# Patient Record
Sex: Female | Born: 1935 | Race: White | Hispanic: No | Marital: Married | Smoking: Former smoker
Health system: Southern US, Community
[De-identification: ages and names within clinical notes are randomized; demographics above are authoritative.]

## PROBLEM LIST (undated history)

## (undated) DIAGNOSIS — Z5189 Encounter for other specified aftercare: Secondary | ICD-10-CM

## (undated) DIAGNOSIS — D518 Other vitamin B12 deficiency anemias: Secondary | ICD-10-CM

## (undated) DIAGNOSIS — E079 Disorder of thyroid, unspecified: Secondary | ICD-10-CM

## (undated) DIAGNOSIS — F32A Depression, unspecified: Secondary | ICD-10-CM

## (undated) DIAGNOSIS — G63 Polyneuropathy in diseases classified elsewhere: Principal | ICD-10-CM

## (undated) DIAGNOSIS — K219 Gastro-esophageal reflux disease without esophagitis: Secondary | ICD-10-CM

## (undated) DIAGNOSIS — G2581 Restless legs syndrome: Secondary | ICD-10-CM

## (undated) DIAGNOSIS — M1990 Unspecified osteoarthritis, unspecified site: Secondary | ICD-10-CM

## (undated) DIAGNOSIS — M069 Rheumatoid arthritis, unspecified: Secondary | ICD-10-CM

## (undated) DIAGNOSIS — F419 Anxiety disorder, unspecified: Secondary | ICD-10-CM

## (undated) DIAGNOSIS — F329 Major depressive disorder, single episode, unspecified: Secondary | ICD-10-CM

## (undated) DIAGNOSIS — H8110 Benign paroxysmal vertigo, unspecified ear: Secondary | ICD-10-CM

## (undated) DIAGNOSIS — I10 Essential (primary) hypertension: Secondary | ICD-10-CM

## (undated) DIAGNOSIS — J449 Chronic obstructive pulmonary disease, unspecified: Secondary | ICD-10-CM

## (undated) DIAGNOSIS — I4891 Unspecified atrial fibrillation: Secondary | ICD-10-CM

## (undated) DIAGNOSIS — K449 Diaphragmatic hernia without obstruction or gangrene: Secondary | ICD-10-CM

## (undated) DIAGNOSIS — H3530 Unspecified macular degeneration: Secondary | ICD-10-CM

## (undated) DIAGNOSIS — R269 Unspecified abnormalities of gait and mobility: Secondary | ICD-10-CM

## (undated) HISTORY — DX: Depression, unspecified: F32.A

## (undated) HISTORY — PX: BACK SURGERY: SHX140

## (undated) HISTORY — PX: TONSILLECTOMY: SUR1361

## (undated) HISTORY — DX: Polyneuropathy in diseases classified elsewhere: G63

## (undated) HISTORY — DX: Essential (primary) hypertension: I10

## (undated) HISTORY — DX: Disorder of thyroid, unspecified: E07.9

## (undated) HISTORY — PX: CHOLECYSTECTOMY: SHX55

## (undated) HISTORY — PX: REPLACEMENT TOTAL KNEE: SUR1224

## (undated) HISTORY — DX: Chronic obstructive pulmonary disease, unspecified: J44.9

## (undated) HISTORY — DX: Benign paroxysmal vertigo, unspecified ear: H81.10

## (undated) HISTORY — DX: Encounter for other specified aftercare: Z51.89

## (undated) HISTORY — DX: Gastro-esophageal reflux disease without esophagitis: K21.9

## (undated) HISTORY — PX: CATARACT EXTRACTION: SUR2

## (undated) HISTORY — DX: Unspecified abnormalities of gait and mobility: R26.9

## (undated) HISTORY — DX: Unspecified osteoarthritis, unspecified site: M19.90

## (undated) HISTORY — PX: KYPHOPLASTY: SHX5884

## (undated) HISTORY — PX: SPINAL CORD STIMULATOR IMPLANT: SHX2422

## (undated) HISTORY — PX: ABDOMINAL HYSTERECTOMY: SHX81

## (undated) HISTORY — DX: Anxiety disorder, unspecified: F41.9

## (undated) HISTORY — DX: Major depressive disorder, single episode, unspecified: F32.9

## (undated) HISTORY — PX: ANKLE FRACTURE SURGERY: SHX122

## (undated) HISTORY — DX: Unspecified atrial fibrillation: I48.91

## (undated) HISTORY — DX: Restless legs syndrome: G25.81

## (undated) HISTORY — DX: Unspecified macular degeneration: H35.30

## (undated) HISTORY — PX: APPENDECTOMY: SHX54

## (undated) HISTORY — DX: Rheumatoid arthritis, unspecified: M06.9

## (undated) HISTORY — DX: Other vitamin B12 deficiency anemias: D51.8

---

## 2011-08-04 ENCOUNTER — Emergency Department (HOSPITAL_COMMUNITY): Payer: Medicare Other

## 2011-08-04 ENCOUNTER — Emergency Department (HOSPITAL_COMMUNITY)
Admission: EM | Admit: 2011-08-04 | Discharge: 2011-08-04 | Disposition: A | Discharge status: Home / Self Care | Payer: Medicare Other | Attending: Emergency Medicine | Admitting: Emergency Medicine

## 2011-08-04 DIAGNOSIS — E039 Hypothyroidism, unspecified: Secondary | ICD-10-CM | POA: Insufficient documentation

## 2011-08-04 DIAGNOSIS — W19XXXA Unspecified fall, initial encounter: Secondary | ICD-10-CM | POA: Insufficient documentation

## 2011-08-04 DIAGNOSIS — S22009A Unspecified fracture of unspecified thoracic vertebra, initial encounter for closed fracture: Secondary | ICD-10-CM | POA: Insufficient documentation

## 2011-08-04 DIAGNOSIS — J4489 Other specified chronic obstructive pulmonary disease: Secondary | ICD-10-CM | POA: Insufficient documentation

## 2011-08-04 DIAGNOSIS — Z96659 Presence of unspecified artificial knee joint: Secondary | ICD-10-CM | POA: Insufficient documentation

## 2011-08-04 DIAGNOSIS — Z79899 Other long term (current) drug therapy: Secondary | ICD-10-CM | POA: Insufficient documentation

## 2011-08-04 DIAGNOSIS — J449 Chronic obstructive pulmonary disease, unspecified: Secondary | ICD-10-CM | POA: Insufficient documentation

## 2011-08-04 DIAGNOSIS — F411 Generalized anxiety disorder: Secondary | ICD-10-CM | POA: Insufficient documentation

## 2011-08-04 DIAGNOSIS — G589 Mononeuropathy, unspecified: Secondary | ICD-10-CM | POA: Insufficient documentation

## 2011-08-04 DIAGNOSIS — I10 Essential (primary) hypertension: Secondary | ICD-10-CM | POA: Insufficient documentation

## 2011-08-18 ENCOUNTER — Other Ambulatory Visit: Payer: Self-pay | Admitting: Orthopedic Surgery

## 2011-08-18 DIAGNOSIS — R52 Pain, unspecified: Secondary | ICD-10-CM

## 2011-08-18 DIAGNOSIS — IMO0002 Reserved for concepts with insufficient information to code with codable children: Secondary | ICD-10-CM

## 2011-08-19 ENCOUNTER — Ambulatory Visit
Admission: RE | Admit: 2011-08-19 | Discharge: 2011-08-19 | Disposition: A | Discharge status: Home / Self Care | Payer: Medicare Other | Source: Ambulatory Visit | Attending: Orthopedic Surgery | Admitting: Orthopedic Surgery

## 2011-08-19 DIAGNOSIS — R52 Pain, unspecified: Secondary | ICD-10-CM

## 2011-08-19 DIAGNOSIS — IMO0002 Reserved for concepts with insufficient information to code with codable children: Secondary | ICD-10-CM

## 2011-09-10 ENCOUNTER — Telehealth: Payer: Self-pay | Admitting: Internal Medicine

## 2011-09-10 NOTE — Telephone Encounter (Signed)
S/w pt today re appt for 12/11 @ 1:30 pm.

## 2011-09-22 ENCOUNTER — Ambulatory Visit: Payer: Medicare Other

## 2011-09-22 ENCOUNTER — Encounter: Payer: Self-pay | Admitting: Internal Medicine

## 2011-09-22 ENCOUNTER — Ambulatory Visit (HOSPITAL_BASED_OUTPATIENT_CLINIC_OR_DEPARTMENT_OTHER): Payer: Medicare Other | Admitting: Internal Medicine

## 2011-09-22 ENCOUNTER — Other Ambulatory Visit: Payer: Medicare Other | Admitting: Lab

## 2011-09-22 ENCOUNTER — Telehealth: Payer: Self-pay | Admitting: Internal Medicine

## 2011-09-22 ENCOUNTER — Ambulatory Visit (HOSPITAL_BASED_OUTPATIENT_CLINIC_OR_DEPARTMENT_OTHER): Payer: Medicare Other

## 2011-09-22 VITALS — BP 137/77 | HR 80 | Temp 97.0°F | Ht 63.5 in | Wt 172.0 lb

## 2011-09-22 DIAGNOSIS — M549 Dorsalgia, unspecified: Secondary | ICD-10-CM

## 2011-09-22 DIAGNOSIS — D892 Hypergammaglobulinemia, unspecified: Secondary | ICD-10-CM

## 2011-09-22 DIAGNOSIS — R894 Abnormal immunological findings in specimens from other organs, systems and tissues: Secondary | ICD-10-CM

## 2011-09-22 LAB — COMPREHENSIVE METABOLIC PANEL
ALT: 13 U/L (ref 0–35)
AST: 22 U/L (ref 0–37)
Albumin: 3.9 g/dL (ref 3.5–5.2)
Alkaline Phosphatase: 70 U/L (ref 39–117)
Glucose, Bld: 91 mg/dL (ref 70–99)
Potassium: 4.2 mEq/L (ref 3.5–5.3)
Sodium: 138 mEq/L (ref 135–145)
Total Bilirubin: 0.3 mg/dL (ref 0.3–1.2)
Total Protein: 7.7 g/dL (ref 6.0–8.3)

## 2011-09-22 LAB — CBC WITH DIFFERENTIAL/PLATELET
BASO%: 0.6 % (ref 0.0–2.0)
Eosinophils Absolute: 0.6 10*3/uL — ABNORMAL HIGH (ref 0.0–0.5)
LYMPH%: 35.7 % (ref 14.0–49.7)
MCHC: 33.5 g/dL (ref 31.5–36.0)
MCV: 94.7 fL (ref 79.5–101.0)
MONO%: 10.8 % (ref 0.0–14.0)
NEUT#: 4.5 10*3/uL (ref 1.5–6.5)
Platelets: 421 10*3/uL — ABNORMAL HIGH (ref 145–400)
RBC: 4.3 10*6/uL (ref 3.70–5.45)
RDW: 14.6 % — ABNORMAL HIGH (ref 11.2–14.5)
WBC: 9.7 10*3/uL (ref 3.9–10.3)

## 2011-09-22 NOTE — Progress Notes (Signed)
REASON FOR CONSULTATION:  77 YO WF with ? Multiple Myeloma.  HPI Katrina Lynn is a 77 y.o. female  With PMH significant for COPD, hypertension, Hypothyroidism, and peripheral neuropathy. She fell down at hhome 6 weeks ago. She had XR of the lumbar spine that showed Age indeterminate compression fracture T12 with minimal loss of vertebral body height and no retropulsion, Mild retropulsion at L1 is likely chronic and Compression fractures at T11, L1-L2 with cement augmentation. The patient was seen by Dr.Gioffre and CT of the lumbar spine was performed on 08/20/11 and it showed Acute mild compression fracture of the superior endplate of L3 with minimal protrusion of bone into the spinal canal. Central soft disc protrusion at L5-S1 slightly asymmetric to the right. Gas in the right lateral recess at L4-5 which might affect the right L5 nerve root but there is no soft disc protrusion. Broad-based small disc protrusion at L3-4 with mild spinal  Stenosis. He also ordered blood work including SPEP which showed the possibility of faint restricted bands in the Gamma region.with elevated IgA to 577 but normal IgG and low IgM. He kindly referred the patient to me today for evaluation of this abnormality and to r/o Multiple Myeloma. The patient continues to complain of low back pain. She is currently on Gabapentin and Tramadol with minimal pain relief. She also has some weight loss secondary to poor appetite and SOB on exertion. No other significant complaints.  @SFHPI @  Past Medical History  Diagnosis Date  . Hypertension   . Anxiety   . Arthritis   . Asthma   . Blood transfusion   . COPD (chronic obstructive pulmonary disease)   . Depression   . GERD (gastroesophageal reflux disease)   . Thyroid disease     Past Surgical History  Procedure Date  . Appendectomy   . Back surgery   . Cholecystectomy   . Abdominal hysterectomy     Family History  Problem Relation Age of Onset  . Stroke Mother      Social History History  Substance Use Topics  . Smoking status: Former Smoker -- 0.2 packs/day for 3 years    Types: Cigarettes    Quit date: 09/22/1971  . Smokeless tobacco: Not on file  . Alcohol Use: 0.6 oz/week    1 Cans of beer per week    Allergies  Allergen Reactions  . Penicillins Swelling    Current Outpatient Prescriptions  Medication Sig Dispense Refill  . amitriptyline (ELAVIL) 25 MG tablet Take 25 mg by mouth 2 (two) times daily.        . folic acid (FOLVITE) 1 MG tablet Take 1 mg by mouth 2 (two) times daily.        Marland Kitchen gabapentin (NEURONTIN) 600 MG tablet Take 600 mg by mouth 3 (three) times daily.        Marland Kitchen levothyroxine (SYNTHROID, LEVOTHROID) 150 MCG tablet Take 150 mcg by mouth daily.        Marland Kitchen losartan (COZAAR) 100 MG tablet Take 100 mg by mouth daily.        . metoprolol (TOPROL-XL) 50 MG 24 hr tablet Take 50 mg by mouth daily.        . multivitamin-lutein (OCUVITE-LUTEIN) CAPS Take 1 capsule by mouth daily. Called brite eyes       . traMADol (ULTRAM) 50 MG tablet Take 50 mg by mouth every 6 (six) hours as needed. Maximum dose= 8 tablets per day  Review of Systems  A comprehensive review of systems was negative except for: Constitutional: positive for anorexia, fatigue and weight loss Respiratory: positive for dyspnea on exertion Musculoskeletal: positive for back pain  Physical Exam  WUJ:WJXBJ, healthy and no distress SKIN: skin color, texture, turgor are normal HEAD: Normocephalic, No masses, lesions, tenderness or abnormalities OROPHARYNX:no exudate, no erythema and lips, buccal mucosa, and tongue normal  NECK: supple, no adenopathy LYMPH:  no palpable lymphadenopathy LUNGS: clear to auscultation  HEART: regular rate & rhythm, no murmurs and no gallops ABDOMEN:abdomen soft, non-tender, normal bowel sounds and no masses or organomegaly BACK: Tender to palpation at the lumbar spines. EXTREMITIES:no joint deformities, effusion, or  inflammation, no edema, no clubbing, no cyanosis  NEURO: alert & oriented x 3 with fluent speech    Studies/Results: No results found.   Assessment: This is a very pleasant 37 YOWF with back pain and compression fracture but no clear lytic lesions on the previous imaging studies. The patient has a faint restricted band in the Gamma region, concerning for monoclonal Gammopathy and slightly elevated IgA. Multiple Myeloma can not be excluded but less likely. I have a lengthy discussion with the patient and her husband today about her condition and studies needed to R/O Multiple Myeloma.  Plan: 1# I ordered CBC, CMET, LDH and Myeloma panel today. 2# I referred the patient to Interventional Radiology for a bone marrow Biopsy and Aspirate. 3# I will see the patient back for follow up visit in 2 weeks for evaluation and discussion of her lab and biopsy results. 4# For Pain management, the patient will continue on Tramadol, Gabapentin and Advil PRN for now.  All questions were answered. The patient knows to call the clinic with any problems, questions or concerns. We can certainly see the patient much sooner if necessary.  Thank you so much for allowing me to participate in the care of Katrina Lynn. I will continue to follow up the patient with you and assist in her care.  I spent 25 minutes counseling the patient face to face. The total time spent in the appointment was .   Kersti Scavone K. 09/22/2011, 3:23 PM

## 2011-09-22 NOTE — Telephone Encounter (Signed)
gve the pt her dec 2012 appt calendar. S/w anna in rad regarding the pt needing  A bm biopsy. Pt is aware she will be contacted with that appt

## 2011-09-23 ENCOUNTER — Telehealth: Payer: Self-pay | Admitting: Internal Medicine

## 2011-09-23 NOTE — Telephone Encounter (Signed)
Tobi Bastos from central scheduling called to let us know  that pt is scheduled for Bone Marrow Bx on 09/29/11 at 0900 at [facility] -she notified the pt

## 2011-09-24 ENCOUNTER — Encounter (HOSPITAL_COMMUNITY): Payer: Self-pay | Admitting: Pharmacy Technician

## 2011-09-24 LAB — KAPPA/LAMBDA LIGHT CHAINS: Kappa:Lambda Ratio: 0.83 (ref 0.26–1.65)

## 2011-09-24 LAB — SPEP & IFE WITH QIG
Beta Globulin: 5.6 % (ref 4.7–7.2)
Gamma Globulin: 14.8 % (ref 11.1–18.8)
IgA: 548 mg/dL — ABNORMAL HIGH (ref 69–380)
IgG (Immunoglobin G), Serum: 1030 mg/dL (ref 690–1700)
Total Protein, Serum Electrophoresis: 7 g/dL (ref 6.0–8.3)

## 2011-09-24 LAB — BETA 2 MICROGLOBULIN, SERUM: Beta-2 Microglobulin: 3.54 mg/L — ABNORMAL HIGH (ref 1.01–1.73)

## 2011-09-28 ENCOUNTER — Other Ambulatory Visit (HOSPITAL_COMMUNITY): Payer: Self-pay | Admitting: *Deleted

## 2011-09-28 ENCOUNTER — Other Ambulatory Visit: Payer: Self-pay | Admitting: Radiology

## 2011-09-29 ENCOUNTER — Encounter (HOSPITAL_COMMUNITY): Payer: Self-pay

## 2011-09-29 ENCOUNTER — Ambulatory Visit (HOSPITAL_COMMUNITY)
Admission: RE | Admit: 2011-09-29 | Discharge: 2011-09-29 | Disposition: A | Discharge status: Home / Self Care | Payer: Medicare Other | Source: Ambulatory Visit | Attending: Internal Medicine | Admitting: Internal Medicine

## 2011-09-29 DIAGNOSIS — D892 Hypergammaglobulinemia, unspecified: Secondary | ICD-10-CM

## 2011-09-29 DIAGNOSIS — C9000 Multiple myeloma not having achieved remission: Secondary | ICD-10-CM | POA: Insufficient documentation

## 2011-09-29 LAB — CBC
HCT: 39.7 % (ref 36.0–46.0)
MCH: 30.6 pg (ref 26.0–34.0)
MCHC: 33.2 g/dL (ref 30.0–36.0)
RDW: 14.7 % (ref 11.5–15.5)

## 2011-09-29 LAB — PROTIME-INR
INR: 0.99 (ref 0.00–1.49)
Prothrombin Time: 13.3 seconds (ref 11.6–15.2)

## 2011-09-29 LAB — APTT: aPTT: 33 seconds (ref 24–37)

## 2011-09-29 MED ORDER — MIDAZOLAM HCL 5 MG/5ML IJ SOLN
INTRAMUSCULAR | Status: AC | PRN
  Administered 2011-09-29: 2 mg via INTRAVENOUS
  Administered 2011-09-29: 1 mg via INTRAVENOUS

## 2011-09-29 MED ORDER — FENTANYL CITRATE 0.05 MG/ML IJ SOLN
INTRAMUSCULAR | Status: AC | PRN
  Administered 2011-09-29: 50 ug via INTRAVENOUS
  Administered 2011-09-29: 100 ug via INTRAVENOUS

## 2011-09-29 MED ORDER — SODIUM CHLORIDE 0.9 % IV SOLN
Freq: Once | INTRAVENOUS | Status: AC
  Administered 2011-09-29: 08:00:00 via INTRAVENOUS

## 2011-09-29 NOTE — H&P (Signed)
Katrina Lynn is an 77 y.o. female.   Chief Complaint: Back Pain HPI: 77 yo female with a history of low back pain and multiple falls. Further back surgery is being considered per patient; however, bone marrow biopsy requested to rule out multiple myeloma.  Past Medical History  Diagnosis Date  . Hypertension   . Anxiety   . Arthritis   . Asthma   . Blood transfusion   . COPD (chronic obstructive pulmonary disease)   . Depression   . GERD (gastroesophageal reflux disease)   . Thyroid disease     Past Surgical History  Procedure Date  . Appendectomy   . Back surgery     x3 lumbar and thorasic  kyphoplasty  . Cholecystectomy   . Abdominal hysterectomy   . Cataract extraction     bilateral 1990    Family History  Problem Relation Age of Onset  . Stroke Mother    Social History:  reports that she quit smoking about 40 years ago. Her smoking use included Cigarettes. She has a .75 pack-year smoking history. She does not have any smokeless tobacco history on file. She reports that she drinks about .6 ounces of alcohol per week. She reports that she does not use illicit drugs.  Allergies:  Allergies  Allergen Reactions  . Penicillins Swelling    Medications Prior to Admission  Medication Sig Dispense Refill  . amitriptyline (ELAVIL) 25 MG tablet Take 50 mg by mouth at bedtime.       . folic acid (FOLVITE) 1 MG tablet Take 1 mg by mouth 2 (two) times daily.        Marland Kitchen gabapentin (NEURONTIN) 600 MG tablet Take 1,200 mg by mouth at bedtime.       Marland Kitchen ibuprofen (ADVIL,MOTRIN) 200 MG tablet Take 200 mg by mouth every 6 (six) hours as needed. PAIN        . levothyroxine (SYNTHROID, LEVOTHROID) 150 MCG tablet Take 150 mcg by mouth every morning.       Marland Kitchen losartan (COZAAR) 100 MG tablet Take 100 mg by mouth every evening.       . metoprolol (TOPROL-XL) 50 MG 24 hr tablet Take 50 mg by mouth every morning.       . multivitamin-lutein (OCUVITE-LUTEIN) CAPS Take 1 capsule by mouth 2 (two)  times daily. Called brite eyes      . traMADol (ULTRAM) 50 MG tablet Take 50 mg by mouth every 6 (six) hours as needed. Maximum dose= 8 tablets per day. PAIN        Medications Prior to Admission  Medication Dose Route Frequency Provider Last Rate Last Dose  . 0.9 %  sodium chloride infusion   Intravenous Once Robet Leu, PA 20 mL/hr at 09/29/11 0759      Results for orders placed during the hospital encounter of 09/29/11 (from the past 48 hour(s))  APTT     Status: Normal   Collection Time   09/29/11  7:50 AM      Component Value Range Comment   aPTT 33  24 - 37 (seconds)   CBC     Status: Abnormal   Collection Time   09/29/11  7:50 AM      Component Value Range Comment   WBC 8.6  4.0 - 10.5 (K/uL)    RBC 4.31  3.87 - 5.11 (MIL/uL)    Hemoglobin 13.2  12.0 - 15.0 (g/dL)    HCT 16.1  09.6 - 04.5 (%)  MCV 92.1  78.0 - 100.0 (fL)    MCH 30.6  26.0 - 34.0 (pg)    MCHC 33.2  30.0 - 36.0 (g/dL)    RDW 69.6  29.5 - 28.4 (%)    Platelets 438 (*) 150 - 400 (K/uL)   PROTIME-INR     Status: Normal   Collection Time   09/29/11  7:50 AM      Component Value Range Comment   Prothrombin Time 13.3  11.6 - 15.2 (seconds)    INR 0.99  0.00 - 1.49     No results found.  Review of Systems  Constitutional: Negative for fever and chills.  Eyes: Positive for blurred vision.  Respiratory: Positive for shortness of breath. Negative for cough and wheezing.   Cardiovascular: Negative for chest pain.  Gastrointestinal: Negative for abdominal pain.  Musculoskeletal: Positive for back pain.  Endo/Heme/Allergies: Does not bruise/bleed easily.    Blood pressure 140/80, pulse 88, temperature 98.5 F (36.9 C), temperature source Oral, resp. rate 18, SpO2 96.00%. Physical Exam  Heent - unremarkable Airway -1 Heart - RRR Lungs - decreased breath sounds but clear Abd - soft NT  Assessment/Plan CT guided bone marrow biopsy today Dr. Miles Costain.  Katrina Lynn 09/29/2011, 9:15 AM

## 2011-09-29 NOTE — Progress Notes (Signed)
Pt states she "is supposed to use Oxygen at home at night" and mostly does this.  2 liters. Doe snot use it during the day

## 2011-09-29 NOTE — Procedures (Signed)
Successful LT iliac BM asp and core bx with CT guidance No comp Stable path pending

## 2011-10-07 ENCOUNTER — Ambulatory Visit (HOSPITAL_BASED_OUTPATIENT_CLINIC_OR_DEPARTMENT_OTHER): Payer: Medicare Other | Admitting: Internal Medicine

## 2011-10-07 VITALS — BP 147/89 | HR 95 | Temp 97.2°F | Ht 63.5 in | Wt 171.1 lb

## 2011-10-07 DIAGNOSIS — M545 Low back pain: Secondary | ICD-10-CM

## 2011-10-07 DIAGNOSIS — D892 Hypergammaglobulinemia, unspecified: Secondary | ICD-10-CM

## 2011-10-07 NOTE — Progress Notes (Signed)
[facility] OFFICE PROGRESS NOTE  DIAGNOSIS: Monospecific paraproteinemia  CURRENT THERAPY: None.  INTERVAL HISTORY: Katrina Lynn 77 y.o. female returns to the clinic today for followup visit accompanied her husband and daughter. The patient still complaining of low back pain. She was seen initially for evaluation of questionable multiple myeloma. She had a bone marrow biopsy and aspirate performed recently and she is here today for evaluation of her biopsy and lab results. Her myeloma panel showed no significant abnormalities. The patient denied having any other significant complaints except for the back pain.  MEDICAL HISTORY: Past Medical History  Diagnosis Date  . Hypertension   . Anxiety   . Arthritis   . Asthma   . Blood transfusion   . COPD (chronic obstructive pulmonary disease)   . Depression   . GERD (gastroesophageal reflux disease)   . Thyroid disease     ALLERGIES:  is allergic to penicillins.  MEDICATIONS:  Current Outpatient Prescriptions  Medication Sig Dispense Refill  . ALPRAZolam (XANAX) 0.5 MG tablet Take 0.25 mg by mouth as needed.        Marland Kitchen amitriptyline (ELAVIL) 25 MG tablet Take 50 mg by mouth at bedtime.       . fluticasone-salmeterol (ADVAIR HFA) 115-21 MCG/ACT inhaler Inhale 2 puffs into the lungs as needed.        . folic acid (FOLVITE) 1 MG tablet Take 1 mg by mouth 2 (two) times daily.        Marland Kitchen gabapentin (NEURONTIN) 600 MG tablet Take 1,200 mg by mouth at bedtime.       Marland Kitchen ibuprofen (ADVIL,MOTRIN) 200 MG tablet Take 200 mg by mouth every 6 (six) hours as needed. PAIN        . levothyroxine (SYNTHROID, LEVOTHROID) 150 MCG tablet Take 150 mcg by mouth every morning.       Marland Kitchen losartan (COZAAR) 100 MG tablet Take 100 mg by mouth every evening.       . metoprolol (TOPROL-XL) 50 MG 24 hr tablet Take 50 mg by mouth every morning.       . multivitamin-lutein (OCUVITE-LUTEIN) CAPS Take 1 capsule by mouth 2 (two) times daily. Called brite eyes       . tiotropium (SPIRIVA HANDIHALER) 18 MCG inhalation capsule Place 18 mcg into inhaler and inhale as needed.        . traMADol (ULTRAM) 50 MG tablet Take 50 mg by mouth every 6 (six) hours as needed. Maximum dose= 8 tablets per day. PAIN         SURGICAL HISTORY:  Past Surgical History  Procedure Date  . Appendectomy   . Back surgery     x3 lumbar and thorasic  kyphoplasty  . Cholecystectomy   . Abdominal hysterectomy   . Cataract extraction     bilateral 1990    REVIEW OF SYSTEMS:  A comprehensive review of systems was negative except for: Musculoskeletal: positive for back pain   PHYSICAL EXAMINATION: General appearance: alert, cooperative and no distress Head: Normocephalic, without obvious abnormality, atraumatic Neck: no adenopathy Lymph nodes: Cervical, supraclavicular, and axillary nodes normal. Resp: clear to auscultation bilaterally Cardio: regular rate and rhythm, S1, S2 normal, no murmur, click, rub or gallop GI: soft, non-tender; bowel sounds normal; no masses,  no organomegaly Extremities: extremities normal, atraumatic, no cyanosis or edema Neurologic: Alert and oriented X 3, normal strength and tone. Normal symmetric reflexes. Normal coordination and gait  ECOG PERFORMANCE STATUS: 1 - Symptomatic but completely ambulatory  Blood  pressure 147/89, pulse 95, temperature 97.2 F (36.2 C), temperature source Oral, height 5' 3.5" (1.613 m), weight 171 lb 1.6 oz (77.61 kg).  LABORATORY DATA: Lab Results  Component Value Date   WBC 8.6 09/29/2011   HGB 13.2 09/29/2011   HCT 39.7 09/29/2011   MCV 92.1 09/29/2011   PLT 438* 09/29/2011      Chemistry      Component Value Date/Time   NA 138 09/22/2011 1557   K 4.2 09/22/2011 1557   CL 102 09/22/2011 1557   CO2 27 09/22/2011 1557   BUN 15 09/22/2011 1557   CREATININE 0.87 09/22/2011 1557      Component Value Date/Time   CALCIUM 9.6 09/22/2011 1557   ALKPHOS 70 09/22/2011 1557   AST 22 09/22/2011 1557   ALT  13 09/22/2011 1557   BILITOT 0.3 09/22/2011 1557       RADIOGRAPHIC STUDIES: Ct Biopsy  09/29/2011  *RADIOLOGY REPORT*  Clinical Data:  Multiple myeloma  CT GUIDED LEFT ILIAC BONE MARROW ASPIRATION AND CORE BIOPSY  Date:  09/29/2011 09:07:00  Radiologist:  Judie Petit. Ruel Favors, M.D.  Medications:  3 mg Versed, 200 mcg Fentanyl  Guidance:  CT  Sedation time:  15 minutes  Contrast volume:  None.  Complications:  No immediate  PROCEDURE/FINDINGS:  Informed consent was obtained from the patient following explanation of the procedure, risks, benefits and alternatives. The patient understands, agrees and consents for the procedure. All questions were addressed.  A time out was performed.  The patient was positioned prone and noncontrast localization CT was performed of the pelvis to demonstrate the iliac marrow spaces.  Maximal barrier sterile technique utilized including caps, mask, sterile gowns, sterile gloves, large sterile drape, hand hygiene, and betadine prep.  Under sterile conditions and local anesthesia, an 11 gauge coaxial bone biopsy needle was advanced into the left iliac marrow space. Needle position was confirmed with CT imaging. Initially, bone marrow aspiration was performed. Next, the 11 gauge outer cannula was utilized to obtain a left iliac bone marrow core biopsy. Needle was removed. Hemostasis was obtained with compression. The patient tolerated the procedure well. Samples were prepared with the cytotechnologist. No immediate complications.  IMPRESSION: CT guided left iliac bone marrow aspiration and core biopsy.  Original Report Authenticated By: Judie Petit. Ruel Favors, M.D.   BONE MARROW REPORT FINAL DIAGNOSIS Diagnosis Bone Marrow, Aspirate,Biopsy, and Clot, left iliac - HYPERCELLULAR BONE MARROW FOR AGE WITH TRILINEAGE HEMATOPOIESIS AND 6% PLASMA CELLS. - MAST CELL HYPERPLASIA. - SCANTY IRON STORES. - SEE COMMENT. PERIPHERAL BLOOD: - SLIGHT THROMBOCYTOSIS. Diagnosis Note The bone  marrow is hypercellular for age with trilineage hematopoiesis. There is slight increase in scattered mast cells throughout the bone marrow but with no clustering as seen in the aspirate, clot and biopsy. This is compatible with mast cell hyperplasia which may be related to the presence of a chronic inflammatory disorder. The plasma cells represent 6% of all cells with associated small clusters composed of few cells. Large clusters or sheets of plasma cells are not identified. Immunochemical stains were performed but were not entirely contributory given the prominent background staining with kappa and lambda light chains. In focal, relatively well stained areas, the plasma cells appear polyclonal. Clinical correlation is strongly recommended. (BNS:jy) 10/01/11  Guerry Bruin MD Pathologist, Electronic Signature (Case signed 10/02/2011)  ASSESSMENT AND PLAN:  This is a very pleasant 77 years old white female with low back pain and nonspecific paraproteinemia. The bone marrow biopsy and aspirate as well  as the myeloma that it did not show any evidence for multiple myeloma, but continues mass cells consistent with inflammatory process. I discussed the biopsy and the lab result with the patient today. I recommended for her observation with followup visit with her primary care physician as well as Dr. Melton Krebs for management of her back pain. I don't see a need for The patient will continue routine followup visit with me at this point but that he be happy to see her in the future if needed.   All questions were answered. The patient knows to call the clinic with any problems, questions or concerns.

## 2011-10-27 ENCOUNTER — Other Ambulatory Visit (HOSPITAL_COMMUNITY): Payer: Self-pay | Admitting: Orthopedic Surgery

## 2011-10-27 DIAGNOSIS — S32030A Wedge compression fracture of third lumbar vertebra, initial encounter for closed fracture: Secondary | ICD-10-CM

## 2011-10-29 ENCOUNTER — Encounter: Payer: Self-pay | Admitting: Internal Medicine

## 2011-11-04 ENCOUNTER — Encounter (HOSPITAL_COMMUNITY)
Admission: RE | Admit: 2011-11-04 | Discharge: 2011-11-04 | Disposition: A | Discharge status: Home / Self Care | Payer: Medicare Other | Source: Ambulatory Visit | Attending: Orthopedic Surgery | Admitting: Orthopedic Surgery

## 2011-11-04 DIAGNOSIS — S32030A Wedge compression fracture of third lumbar vertebra, initial encounter for closed fracture: Secondary | ICD-10-CM

## 2011-11-04 DIAGNOSIS — M545 Low back pain, unspecified: Secondary | ICD-10-CM | POA: Insufficient documentation

## 2011-11-04 DIAGNOSIS — Z96659 Presence of unspecified artificial knee joint: Secondary | ICD-10-CM | POA: Insufficient documentation

## 2011-11-04 MED ORDER — TECHNETIUM TC 99M MEDRONATE IV KIT
25.0000 | PACK | Freq: Once | INTRAVENOUS | Status: AC | PRN
  Administered 2011-11-04: 25 via INTRAVENOUS

## 2011-11-10 ENCOUNTER — Other Ambulatory Visit: Payer: Self-pay | Admitting: Orthopedic Surgery

## 2011-11-10 DIAGNOSIS — R1900 Intra-abdominal and pelvic swelling, mass and lump, unspecified site: Secondary | ICD-10-CM

## 2011-11-12 ENCOUNTER — Ambulatory Visit
Admission: RE | Admit: 2011-11-12 | Discharge: 2011-11-12 | Disposition: A | Discharge status: Home / Self Care | Payer: Medicare Other | Source: Ambulatory Visit | Attending: Orthopedic Surgery | Admitting: Orthopedic Surgery

## 2011-11-12 DIAGNOSIS — R1900 Intra-abdominal and pelvic swelling, mass and lump, unspecified site: Secondary | ICD-10-CM

## 2011-11-12 MED ORDER — IOHEXOL 300 MG/ML  SOLN: 100.0000 mL | Freq: Once | INTRAMUSCULAR | Status: AC | PRN

## 2011-11-12 MED ORDER — IOHEXOL 300 MG/ML  SOLN
100.0000 mL | Freq: Once | INTRAMUSCULAR | Status: AC | PRN
  Administered 2011-11-12: 100 mL via INTRAVENOUS

## 2013-06-03 IMAGING — CT CT BIOPSY
1 series · 1 of 16 positions shown · non-contrast
Comparison: none

CLINICAL DATA: Multiple myeloma

[Series 2: localizer · axial · 5.0mm · 0.69mm/px · 1 of 16 slices shown]
[im 9/16]
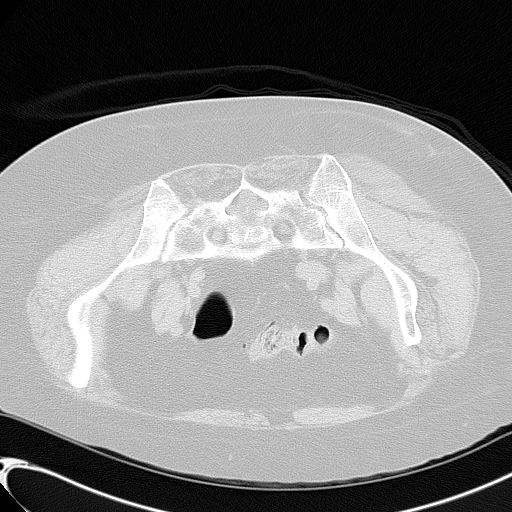

[1 of 16 positions shown; findings below may reference images not displayed]

CT GUIDED LEFT ILIAC BONE MARROW ASPIRATION AND CORE BIOPSY

Date:  09/29/2011 [DATE]

Radiologist:  Motobere Romai, M.D.

Medications:  3 mg Versed, 200 mcg Fentanyl

Guidance:  CT

Sedation time:  15 minutes

Contrast volume:  None.

Complications:  No immediate

PROCEDURE/FINDINGS:

Informed consent was obtained from the patient following
explanation of the procedure, risks, benefits and alternatives.
The patient understands, agrees and consents for the procedure.
All questions were addressed.  A time out was performed.

The patient was positioned prone and noncontrast localization CT
was performed of the pelvis to demonstrate the iliac marrow spaces.

Maximal barrier sterile technique utilized including caps, mask,
sterile gowns, sterile gloves, large sterile drape, hand hygiene,
and betadine prep.

Under sterile conditions and local anesthesia, an 11 gauge coaxial
bone biopsy needle was advanced into the left iliac marrow space.
Needle position was confirmed with CT imaging. Initially, bone
marrow aspiration was performed. Next, the 11 gauge outer cannula
was utilized to obtain a left iliac bone marrow core biopsy. Needle
was removed. Hemostasis was obtained with compression. The patient
tolerated the procedure well. Samples were prepared with the
cytotechnologist. No immediate complications.
IMPRESSION: CT guided left iliac bone marrow aspiration and core biopsy.

## 2013-07-21 ENCOUNTER — Encounter: Payer: Self-pay | Admitting: Neurology

## 2013-07-24 ENCOUNTER — Encounter: Payer: Self-pay | Admitting: Neurology

## 2013-07-24 ENCOUNTER — Ambulatory Visit (INDEPENDENT_AMBULATORY_CARE_PROVIDER_SITE_OTHER): Payer: Medicare Other | Admitting: Neurology

## 2013-07-24 ENCOUNTER — Encounter (INDEPENDENT_AMBULATORY_CARE_PROVIDER_SITE_OTHER): Payer: Self-pay

## 2013-07-24 VITALS — BP 143/89 | HR 91 | Ht 65.0 in | Wt 181.0 lb

## 2013-07-24 DIAGNOSIS — G63 Polyneuropathy in diseases classified elsewhere: Secondary | ICD-10-CM

## 2013-07-24 DIAGNOSIS — H8110 Benign paroxysmal vertigo, unspecified ear: Secondary | ICD-10-CM

## 2013-07-24 DIAGNOSIS — G2581 Restless legs syndrome: Secondary | ICD-10-CM | POA: Insufficient documentation

## 2013-07-24 HISTORY — DX: Polyneuropathy in diseases classified elsewhere: G63

## 2013-07-24 HISTORY — DX: Restless legs syndrome: G25.81

## 2013-07-24 HISTORY — DX: Benign paroxysmal vertigo, unspecified ear: H81.10

## 2013-07-24 MED ORDER — DULOXETINE HCL 30 MG PO CPEP: ORAL_CAPSULE | ORAL | Status: DC

## 2013-07-24 NOTE — Progress Notes (Signed)
Reason for visit: Dizziness  Katrina Lynn is a 79 y.o. female  History of present illness:  Katrina Lynn is an 79 year old right-handed white female with a history of a peripheral neuropathy associated with a gait disturbance. The patient indicates that she has had neuropathy symptoms for at least 12 years. Within the last several weeks, the patient has developed dizziness associated with a true vertigo sensation. The patient indicates that her initial symptoms began when she was lying in bed, and she rolled to the right. The patient had onset of true vertigo associated with some nausea, no vomiting. The patient has also noted that the vertigo will come on if she sits up out of bed, with vertigo lasting about 30 seconds. The patient will have episodes of vertigo off and on throughout the day if she turns her head a particular way. The patient denies any headache, double vision, loss of vision, or numbness or weakness that is new on the arms, legs, or face. The patient denies any change in hearing, but she indicates that several years ago she lost hearing in her left ear. The patient was seen through an ENT doctor at that time, and was told she had a viral illness. The patient does have gait instability associated with her neuropathy, and she has a lot of burning and stinging in her feet at night. The patient has difficulty sleeping because of this. The patient had a squeezing sensation in the feet. The patient denies any true syncope, and she denies neck discomfort. The patient does have chronic low back pain, and she has a spinal cord stimulator in place. The patient is sent to this office for an evaluation.  Past Medical History  Diagnosis Date  . Hypertension   . Anxiety   . Arthritis   . Asthma   . Blood transfusion   . COPD (chronic obstructive pulmonary disease)   . Depression   . GERD (gastroesophageal reflux disease)   . Thyroid disease   . Polyneuropathy in other diseases classified  elsewhere 07/24/2013  . Benign paroxysmal positional vertigo 07/24/2013  . Restless legs syndrome (RLS) 07/24/2013  . Rheumatoid arthritis   . Macular degeneration   . Atrial fibrillation     Past Surgical History  Procedure Laterality Date  . Appendectomy    . Back surgery      x3 lumbar and thorasic  kyphoplasty  . Cholecystectomy    . Abdominal hysterectomy    . Cataract extraction      bilateral 1990  . Tonsillectomy    . Spinal cord stimulator implant    . Replacement total knee      Right  . Ankle fracture surgery      Left    Family History  Problem Relation Age of Onset  . Stroke Mother   . Emphysema Sister   . Heart disease Sister     Social history:  reports that she quit smoking about 41 years ago. Her smoking use included Cigarettes. She has a .75 pack-year smoking history. She has never used smokeless tobacco. She reports that she drinks about 0.6 ounces of alcohol per week. She reports that she does not use illicit drugs.  Medications:  Current Outpatient Prescriptions on File Prior to Visit  Medication Sig Dispense Refill  . ALPRAZolam (XANAX) 0.5 MG tablet Take 0.25 mg by mouth as needed.        . folic acid (FOLVITE) 1 MG tablet Take 1 mg by mouth 2 (two) times  daily.        . gabapentin (NEURONTIN) 600 MG tablet Take 600 mg by mouth 3 (three) times daily.       Marland Kitchen ibuprofen (ADVIL,MOTRIN) 200 MG tablet Take 200 mg by mouth every 6 (six) hours as needed. PAIN        . levothyroxine (SYNTHROID, LEVOTHROID) 150 MCG tablet Take 150 mcg by mouth every morning.       Marland Kitchen losartan (COZAAR) 100 MG tablet Take 100 mg by mouth every evening.       . metoprolol (TOPROL-XL) 50 MG 24 hr tablet Take 50 mg by mouth every morning.       . multivitamin-lutein (OCUVITE-LUTEIN) CAPS Take 1 capsule by mouth 2 (two) times daily. Called brite eyes      . tiotropium (SPIRIVA HANDIHALER) 18 MCG inhalation capsule Place 18 mcg into inhaler and inhale as needed.         No  current facility-administered medications on file prior to visit.      Allergies  Allergen Reactions  . Penicillins Swelling  . Iohexol Hives    Hives and itching after inj    ROS:  Out of a complete 14 system review of symptoms, the patient complains only of the following symptoms, and all other reviewed systems are negative.  Fatigue Swelling in the legs Hearing loss, vertigo Birthmarks Blurred vision, loss of vision Shortness of breath Diarrhea Urination problems Easy bruising Feeling hot, cold, increased thirst Joint pain, muscle cramps, achy muscles Allergies Numbness, weakness, dizziness Anxiety, decreased energy Restless legs  Blood pressure 143/89, pulse 91, height 5\' 5"  (1.651 m), weight 181 lb (82.101 kg).  Physical Exam  General: The patient is alert and cooperative at the time of the examination. Tympanic membranes are clear bilaterally.  Head: Pupils are equal, round, and reactive to light. Discs are flat bilaterally.  Neck: The neck is supple, no carotid bruits are noted.  Respiratory: The respiratory examination is clear.  Cardiovascular: The cardiovascular examination reveals a regular rate and rhythm, no obvious murmurs or rubs are noted.  Skin: Extremities are without significant edema.  Neurologic Exam  Mental status:  Cranial nerves: Facial symmetry is present. There is good sensation of the face to pinprick and soft touch bilaterally. The strength of the facial muscles and the muscles to head turning and shoulder shrug are normal bilaterally. Speech is well enunciated, no aphasia or dysarthria is noted. Extraocular movements are full. Visual fields are full.  Motor: The motor testing reveals 5 over 5 strength of all 4 extremities. Good symmetric motor tone is noted throughout.  Sensory: Sensory testing is intact to pinprick, soft touch, vibration sensation, and position sense on the upper extremities. With the lower extremities, the patient  has a stocking pattern pinprick sensory deficit up to the knees bilaterally. Vibration sensation is impaired in the right greater than left lower extremities, and position sense is significantly impaired in both feet. No evidence of extinction is noted.  Coordination: Cerebellar testing reveals good finger-nose-finger and heel-to-shin bilaterally. The Nyan-Barrany procedure was negative.  Gait and station: Gait is slightly wide-based, the patient is able to walk independently. Tandem gait is unsteady. Romberg is positive. No drift is seen.  Reflexes: Deep tendon reflexes are symmetric, but are depressed bilaterally. Toes are downgoing bilaterally.   Assessment/Plan:  1. Peripheral neuropathy  2. Probable benign positional vertigo  3. Gait disorder  4. Restless leg syndrome  The patient will be set up for a cerebrovascular workup to  exclude this as an etiology for her dizziness, but the patient likely has benign positional vertigo. The patient indicates that her vertigo has improved over the last several days. If this recurs, the patient will be sent for vestibular rehabilitation. The patient will undergo a CT scan of the brain, and a carotid Doppler study. The patient will undergo blood work today, and she will have a nerve conduction study done. The patient will followup in 4 months. The patient will be given a trial on Cymbalta for her neuropathy discomfort. The patient will remain on her gabapentin.  Jill Alexanders MD 07/24/2013 7:12 PM  [facility] [address] [place], [place] [address]  Phone [phone] Fax [phone]

## 2013-07-26 ENCOUNTER — Telehealth: Payer: Self-pay | Admitting: Neurology

## 2013-07-26 LAB — IFE AND PE, SERUM
Alpha 1: 0.2 g/dL (ref 0.1–0.4)
Alpha2 Glob SerPl Elph-Mcnc: 0.7 g/dL (ref 0.4–1.2)
B-Globulin SerPl Elph-Mcnc: 1.3 g/dL (ref 0.6–1.3)
IgA/Immunoglobulin A, Serum: 559 mg/dL — ABNORMAL HIGH (ref 91–414)
IgG (Immunoglobin G), Serum: 737 mg/dL (ref 700–1600)
IgM (Immunoglobulin M), Srm: 18 mg/dL — ABNORMAL LOW (ref 40–230)
Total Protein: 6.5 g/dL (ref 6.0–8.5)

## 2013-07-26 LAB — ANGIOTENSIN CONVERTING ENZYME: Angio Convert Enzyme: 34 U/L (ref 14–82)

## 2013-07-26 LAB — TSH

## 2013-07-26 LAB — VITAMIN B12: Vitamin B-12: 146 pg/mL — ABNORMAL LOW (ref 211–946)

## 2013-07-26 NOTE — Telephone Encounter (Signed)
I called patient. The blood work revealed evidence of a low B12 level, the patient will go on 1000 mcg tablets of vitamin B12 daily. Thyroid was slightly low, I'll send results to primary care physician. The patient is on Synthroid.

## 2013-08-01 ENCOUNTER — Telehealth: Payer: Self-pay | Admitting: Neurology

## 2013-08-01 ENCOUNTER — Ambulatory Visit
Admission: RE | Admit: 2013-08-01 | Discharge: 2013-08-01 | Disposition: A | Discharge status: Home / Self Care | Payer: Medicare Other | Source: Ambulatory Visit | Attending: Neurology | Admitting: Neurology

## 2013-08-01 ENCOUNTER — Ambulatory Visit (INDEPENDENT_AMBULATORY_CARE_PROVIDER_SITE_OTHER): Payer: Medicare Other | Admitting: Radiology

## 2013-08-01 DIAGNOSIS — H8110 Benign paroxysmal vertigo, unspecified ear: Secondary | ICD-10-CM

## 2013-08-01 DIAGNOSIS — G63 Polyneuropathy in diseases classified elsewhere: Secondary | ICD-10-CM

## 2013-08-01 DIAGNOSIS — G2581 Restless legs syndrome: Secondary | ICD-10-CM

## 2013-08-01 DIAGNOSIS — R42 Dizziness and giddiness: Secondary | ICD-10-CM

## 2013-08-01 NOTE — Telephone Encounter (Signed)
I called patient. The nerve conduction studies show evidence of a peripheral neuropathy of moderate severity. This would be severe enough to impact balance to some degree. The CT scan of brain by my reading appears to be relatively unremarkable with exception of some cortical atrophy. No significant small vessel disease is noted.

## 2013-08-01 NOTE — Procedures (Signed)
  HISTORY:  Katrina Lynn is an 79 year old patient with a history of a peripheral neuropathy associated with gait instability and burning in the feet. The patient also has a history of dizziness. The patient is being evaluated for the peripheral neuropathy.   NERVE CONDUCTION STUDIES:  Nerve conduction studies were performed on the right upper extremity. The distal motor latencies and motor amplitudes for the median and ulnar nerves were within normal limits, with normal F wave latencies and nerve conduction velocities seen. The sensory latency for the right median nerve was slightly prolonged, and borderline normal for the right ulnar nerve.  Nerve conduction studies were performed on both lower extremities. The distal motor latency for the right peroneal nerve was prolonged, normal on the left. Low motor amplitudes were noted for these nerves. The distal motor latencies for the posterior tibial nerves were within normal limits bilaterally, with low motor amplitudes for these nerves bilaterally. The nerve conduction velocities for the right peroneal nerve was low, normal on the left, and slowed for the right posterior tibial nerve, normal on the left. The F wave latencies for the peroneal nerves were absent on the right, prolonged on the left, and normal for the left posterior tibial nerve, prolonged on the right. The peroneal sensory latencies were absent bilaterally.  EMG STUDIES:   EMG evaluation was not performed.  IMPRESSION:  Nerve conduction studies done on the right upper extremity and on both lower extremities shows evidence of a primarily axonal peripheral neuropathy of moderate severity. No other significant abnormalities were seen.  Marlan Palau MD 08/01/2013 5:33 PM  [facility] [address] [place], [place] [address]  Phone [phone] Fax [phone]

## 2013-08-08 ENCOUNTER — Ambulatory Visit (INDEPENDENT_AMBULATORY_CARE_PROVIDER_SITE_OTHER): Payer: Medicare Other

## 2013-08-08 DIAGNOSIS — G2581 Restless legs syndrome: Secondary | ICD-10-CM

## 2013-08-08 DIAGNOSIS — G63 Polyneuropathy in diseases classified elsewhere: Secondary | ICD-10-CM

## 2013-08-08 DIAGNOSIS — H8110 Benign paroxysmal vertigo, unspecified ear: Secondary | ICD-10-CM

## 2013-08-08 DIAGNOSIS — R42 Dizziness and giddiness: Secondary | ICD-10-CM

## 2013-08-14 ENCOUNTER — Telehealth: Payer: Self-pay | Admitting: Neurology

## 2013-08-14 DIAGNOSIS — M545 Low back pain: Secondary | ICD-10-CM

## 2013-08-14 NOTE — Telephone Encounter (Signed)
I called patient. The carotid Doppler study was unremarkable. The patient is having low back pain following a fall. I will check x-rays of the low back, the patient has had compression fractures in the past requiring kyphoplasty.

## 2013-08-18 ENCOUNTER — Telehealth: Payer: Self-pay | Admitting: Neurology

## 2013-08-18 ENCOUNTER — Ambulatory Visit
Admission: RE | Admit: 2013-08-18 | Discharge: 2013-08-18 | Disposition: A | Discharge status: Home / Self Care | Payer: Medicare Other | Source: Ambulatory Visit | Attending: Neurology | Admitting: Neurology

## 2013-08-18 DIAGNOSIS — M545 Low back pain: Secondary | ICD-10-CM

## 2013-08-18 NOTE — Telephone Encounter (Signed)
I called patient. The x-ray of the lumbar spine did not show any new compression fractures. The patient had kyphoplasty previously at T11, L1 and L2 levels. The patient also has endplate fractures at the T12, L3 and L4 levels. The patient still having some back pain, she will be entering water aerobics for coming exercises.

## 2013-12-29 ENCOUNTER — Other Ambulatory Visit: Payer: Self-pay

## 2013-12-29 MED ORDER — DULOXETINE HCL 30 MG PO CPEP: ORAL_CAPSULE | ORAL | Status: DC

## 2014-01-25 ENCOUNTER — Encounter: Payer: Self-pay | Admitting: Neurology

## 2014-01-25 ENCOUNTER — Ambulatory Visit (INDEPENDENT_AMBULATORY_CARE_PROVIDER_SITE_OTHER): Payer: Medicare Other | Admitting: Neurology

## 2014-01-25 ENCOUNTER — Encounter (INDEPENDENT_AMBULATORY_CARE_PROVIDER_SITE_OTHER): Payer: Self-pay

## 2014-01-25 VITALS — BP 166/79 | HR 83 | Wt 166.0 lb

## 2014-01-25 DIAGNOSIS — G2581 Restless legs syndrome: Secondary | ICD-10-CM

## 2014-01-25 DIAGNOSIS — G63 Polyneuropathy in diseases classified elsewhere: Secondary | ICD-10-CM

## 2014-01-25 DIAGNOSIS — D518 Other vitamin B12 deficiency anemias: Secondary | ICD-10-CM | POA: Insufficient documentation

## 2014-01-25 DIAGNOSIS — H8110 Benign paroxysmal vertigo, unspecified ear: Secondary | ICD-10-CM

## 2014-01-25 DIAGNOSIS — R269 Unspecified abnormalities of gait and mobility: Secondary | ICD-10-CM

## 2014-01-25 HISTORY — DX: Unspecified abnormalities of gait and mobility: R26.9

## 2014-01-25 HISTORY — DX: Other vitamin B12 deficiency anemias: D51.8

## 2014-01-25 MED ORDER — DULOXETINE HCL 60 MG PO CPEP: 60.0000 mg | ORAL_CAPSULE | Freq: Two times a day (BID) | ORAL | Status: DC

## 2014-01-25 NOTE — Progress Notes (Signed)
Reason for visit: Peripheral neuropathy  Katrina Lynn is an 80 y.o. female  History of present illness:  Katrina Lynn is at 80 year old right-handed white female with a history of a peripheral neuropathy of moderate severity. The patient does have some discomfort in the legs, from the knee level down. The patient is on gabapentin, Effexor, and Cymbalta without much benefit. The patient does have a gait disorder, and she will fall on occasion. The patient has a cane, and she has a scooter that she uses in the home. The patient comes in today without using any assistive device for walking. The patient also indicates that over the last 3 days, her positional vertigo has recurred. The patient is having some nausea associated with this. The patient has chronic low back pain, and she has a spinal stimulator in place. The patient indicates that the spinal stimulator also helps her peripheral neuropathy pain. The patient returns for an evaluation.  Past Medical History  Diagnosis Date  . Hypertension   . Anxiety   . Arthritis   . Asthma   . Blood transfusion   . COPD (chronic obstructive pulmonary disease)   . Depression   . GERD (gastroesophageal reflux disease)   . Thyroid disease   . Polyneuropathy in other diseases classified elsewhere 07/24/2013  . Benign paroxysmal positional vertigo 07/24/2013  . Restless legs syndrome (RLS) 07/24/2013  . Rheumatoid arthritis   . Macular degeneration   . Atrial fibrillation   . Abnormality of gait 01/25/2014  . Other vitamin B12 deficiency anemia 01/25/2014    Past Surgical History  Procedure Laterality Date  . Appendectomy    . Back surgery      x3 lumbar and thorasic  kyphoplasty  . Cholecystectomy    . Abdominal hysterectomy    . Cataract extraction      bilateral 1990  . Tonsillectomy    . Spinal cord stimulator implant    . Replacement total knee      Right  . Ankle fracture surgery      Left    Family History  Problem Relation Age  of Onset  . Stroke Mother   . Emphysema Sister   . Heart disease Sister     Social history:  reports that she quit smoking about 42 years ago. Her smoking use included Cigarettes. She has a .75 pack-year smoking history. She has never used smokeless tobacco. She reports that she drinks about .6 ounces of alcohol per week. She reports that she does not use illicit drugs.    Allergies  Allergen Reactions  . Penicillins Swelling  . Iohexol Hives    Hives and itching after inj    Medications:  Current Outpatient Prescriptions on File Prior to Visit  Medication Sig Dispense Refill  . ALPRAZolam (XANAX) 0.5 MG tablet Take 0.25 mg by mouth as needed.        . folic acid (FOLVITE) 1 MG tablet Take 1 mg by mouth 2 (two) times daily.        Marland Kitchen FORTEO 600 MCG/2.4ML SOLN Take 600 mcg by mouth daily.      Marland Kitchen gabapentin (NEURONTIN) 600 MG tablet Take 600 mg by mouth 3 (three) times daily.       Marland Kitchen HYDROcodone-acetaminophen (NORCO/VICODIN) 5-325 MG per tablet Take 5-325 tablets by mouth daily.      Marland Kitchen ibuprofen (ADVIL,MOTRIN) 200 MG tablet Take 200 mg by mouth every 6 (six) hours as needed. PAIN        .  levothyroxine (SYNTHROID, LEVOTHROID) 150 MCG tablet Take 150 mcg by mouth every morning.       Marland Kitchen losartan (COZAAR) 100 MG tablet Take 100 mg by mouth every evening.       . meclizine (ANTIVERT) 12.5 MG tablet Take 12.5 mg by mouth daily.      . metoprolol (TOPROL-XL) 50 MG 24 hr tablet Take 50 mg by mouth every morning.       . montelukast (SINGULAIR) 10 MG tablet Take 10 mg by mouth daily.      . multivitamin-lutein (OCUVITE-LUTEIN) CAPS Take 1 capsule by mouth 2 (two) times daily. Called brite eyes      . mupirocin ointment (BACTROBAN) 2 % Apply 2 application topically as needed.      . predniSONE (DELTASONE) 5 MG tablet Take 5 mg by mouth daily.      Marland Kitchen tiotropium (SPIRIVA HANDIHALER) 18 MCG inhalation capsule Place 18 mcg into inhaler and inhale as needed.        . vitamin B-12 (CYANOCOBALAMIN)  1000 MCG tablet Take 1,000 mcg by mouth daily.       No current facility-administered medications on file prior to visit.    ROS:  Out of a complete 14 system review of symptoms, the patient complains only of the following symptoms, and all other reviewed systems are negative.  Fatigue Loss of vision Shortness of breath Leg swelling Excessive thirst Back pain Dizziness Anxiety  Blood pressure 166/79, pulse 83, weight 166 lb (75.297 kg).  Physical Exam  General: The patient is alert and cooperative at the time of the examination.  Skin: 1+ edema of ankles is noted bilaterally.   Neurologic Exam  Mental status: The patient is oriented x 3.  Cranial nerves: Facial symmetry is present. Speech is normal, no aphasia or dysarthria is noted. Extraocular movements are full. Visual fields are full.  Motor: The patient has good strength in all 4 extremities. The patient however is unable to walk on heels bilaterally suggesting mild foot drops.  Sensory examination: Soft touch sensation is decreased in the legs, symmetric soft touch sensation is symmetric in the face and arms.  Coordination: The patient has good finger-nose-finger and heel-to-shin bilaterally.  Gait and station: The patient has a slightly wide-based gait. Tandem gait is unsteady. Romberg is positive, the patient will fall forward. No drift is seen.  Reflexes: Deep tendon reflexes are symmetric, but are depressed throughout..   Assessment/Plan:  One. Peripheral neuropathy  2. Vitamin B12 deficiency  3. Gait disorder  4. Benign positional vertigo  5. Chronic low back pain, spinal stimulator placement  The patient is to go on Cymbalta taking 90 mg daily for 2 weeks, then go to 120 mg a day. The patient will be maintained on the gabapentin for now. In the future, a topical ointment for the neuropathy may be done. The patient will be sent for physical therapy for vestibular therapy for the benign positional  vertigo. The patient will have a vitamin B12 level checked again. If this remains low, the patient may need to go on IM injections of vitamin B12. The patient has been on oral supplementation getting 1000 mcg daily of the B12. The patient will followup in 4 or 5 months.  Jill Alexanders MD 01/25/2014 9:16 PM  [facility] [address] [place], [place] [address]  Phone [phone] Fax [phone]

## 2014-01-25 NOTE — Patient Instructions (Signed)

## 2014-01-26 LAB — VITAMIN B12: VITAMIN B 12: 728 pg/mL (ref 211–946)

## 2014-02-05 ENCOUNTER — Other Ambulatory Visit: Payer: Self-pay | Admitting: Anesthesiology

## 2014-02-08 ENCOUNTER — Encounter (HOSPITAL_COMMUNITY)
Admission: RE | Admit: 2014-02-08 | Discharge: 2014-02-08 | Disposition: A | Discharge status: Home / Self Care | Payer: Medicare Other | Source: Ambulatory Visit | Attending: Anesthesiology | Admitting: Anesthesiology

## 2014-02-08 ENCOUNTER — Encounter (HOSPITAL_COMMUNITY): Payer: Self-pay

## 2014-02-08 DIAGNOSIS — K449 Diaphragmatic hernia without obstruction or gangrene: Secondary | ICD-10-CM | POA: Insufficient documentation

## 2014-02-08 DIAGNOSIS — Z01810 Encounter for preprocedural cardiovascular examination: Secondary | ICD-10-CM | POA: Insufficient documentation

## 2014-02-08 DIAGNOSIS — Z01818 Encounter for other preprocedural examination: Secondary | ICD-10-CM | POA: Insufficient documentation

## 2014-02-08 DIAGNOSIS — Z01812 Encounter for preprocedural laboratory examination: Secondary | ICD-10-CM | POA: Insufficient documentation

## 2014-02-08 LAB — BASIC METABOLIC PANEL
BUN: 15 mg/dL (ref 6–23)
CO2: 30 mEq/L (ref 19–32)
Calcium: 9.5 mg/dL (ref 8.4–10.5)
Chloride: 104 mEq/L (ref 96–112)
Creatinine, Ser: 0.77 mg/dL (ref 0.50–1.10)
GFR, EST AFRICAN AMERICAN: 89 mL/min — AB (ref >90)
GFR, EST NON AFRICAN AMERICAN: 77 mL/min — AB (ref >90)
Glucose, Bld: 101 mg/dL — ABNORMAL HIGH (ref 70–99)
Potassium: 5.1 mEq/L (ref 3.7–5.3)
SODIUM: 145 meq/L (ref 137–147)

## 2014-02-08 LAB — CBC
HEMATOCRIT: 43.2 % (ref 36.0–46.0)
Hemoglobin: 14.8 g/dL (ref 12.0–15.0)
MCH: 33 pg (ref 26.0–34.0)
MCHC: 34.3 g/dL (ref 30.0–36.0)
MCV: 96.4 fL (ref 78.0–100.0)
Platelets: 402 10*3/uL — ABNORMAL HIGH (ref 150–400)
RBC: 4.48 MIL/uL (ref 3.87–5.11)
RDW: 15.5 % (ref 11.5–15.5)
WBC: 10 10*3/uL (ref 4.0–10.5)

## 2014-02-08 NOTE — Progress Notes (Signed)
Patient states she had stress test in [place] "yrs ago"  Denies any heart issues at the moment.  PCP is Dr. Melford Aase.  DA

## 2014-02-15 MED ORDER — VANCOMYCIN HCL IN DEXTROSE 1-5 GM/200ML-% IV SOLN
1000.0000 mg | INTRAVENOUS | Status: AC
  Administered 2014-02-16: 1000 mg via INTRAVENOUS
  Filled 2014-02-15: qty 200

## 2014-02-15 NOTE — H&P (Signed)
Katrina Lynn is an 80 y.o. female.   Chief Complaint: back and leg pain HPI:Katrina Lynn returned in March, 2015 after last being seen July 19, 2013 at which time she was doing quite well with her spinal cord stimulator implant.  She is a patient of Dr. Luetta Nutting  For whom we did a permanent spinal cord stimulator implant.  Tthe patient stated that she felt like her IPG was loose and moving.  She also was not getting good coverage of the left side of her back.  She continues to feel that the stimulator is quite effective for her peripheral neuropathy however she is lacking in good coverage of her back pain.  Erlene Quan from [facility] spent quite some time trying to reprogram her.  Unfortunately he was not able to get good coverage of her left side.  We did a thoracic AP and lateral x-ray which did not show any fracture or migration of the leads however electronically the left lead is not working.  The patient also complains of a pinprick type sensation at times right at the anchor site.  It is possible that there could be a fracture in the lead right at the site that we are not seeing on x-ray.  She is somewhat tender to palpation over that site as well.  IPG itself is in correct position and is not "loose."  The patient has lost some weight and does have some excess skin which gives the sensation of it being loose however the IPG still seems to be in the pocket.  4-sided the patient has some frequent falls as well which could have disrupted her stimulator.  I encouraged her to begin ambulating with a cane for stability.   Past Medical History  Diagnosis Date  . Hypertension   . Anxiety   . Arthritis   . Asthma   . Blood transfusion   . COPD (chronic obstructive pulmonary disease)   . Depression   . GERD (gastroesophageal reflux disease)   . Thyroid disease   . Polyneuropathy in other diseases classified elsewhere 07/24/2013  . Benign paroxysmal positional vertigo 07/24/2013  . Restless legs  syndrome (RLS) 07/24/2013  . Rheumatoid arthritis   . Macular degeneration   . Atrial fibrillation   . Abnormality of gait 01/25/2014  . Other vitamin B12 deficiency anemia 01/25/2014    Past Surgical History  Procedure Laterality Date  . Appendectomy    . Back surgery      x3 lumbar and thorasic  kyphoplasty  . Cholecystectomy    . Abdominal hysterectomy    . Cataract extraction      bilateral 1990  . Tonsillectomy    . Spinal cord stimulator implant    . Replacement total knee      Right  . Ankle fracture surgery      Left  . Kyphoplasty       x  3    Family History  Problem Relation Age of Onset  . Stroke Mother   . Emphysema Sister   . Heart disease Sister    Social History:  reports that she quit smoking about 42 years ago. Her smoking use included Cigarettes. She has a .75 pack-year smoking history. She has never used smokeless tobacco. She reports that she drinks about .6 ounces of alcohol per week. She reports that she does not use illicit drugs.  Allergies:  Allergies  Allergen Reactions  . Penicillins Swelling  . Iohexol Hives    Hives and itching  after inj    No prescriptions prior to admission    No results found for this or any previous visit (from the past 48 hour(s)). No results found.  Review of Systems  Constitutional: Negative.   Respiratory: Negative.   Cardiovascular: Negative.   Gastrointestinal: Negative.   Musculoskeletal: Positive for back pain.  Skin: Negative.   Neurological: Negative.   Endo/Heme/Allergies: Negative.   Psychiatric/Behavioral: Negative.     There were no vitals taken for this visit. Physical Exam  Constitutional: She is oriented to person, place, and time. She appears well-developed and well-nourished.  HENT:  Head: Normocephalic and atraumatic.  Eyes: Conjunctivae are normal. Pupils are equal, round, and reactive to light.  Cardiovascular: Normal rate and regular rhythm.   Musculoskeletal: Normal range of  motion.  Neurological: She is alert and oriented to person, place, and time.  Skin: Skin is warm and dry.  Psychiatric: She has a normal mood and affect. Her behavior is normal. Thought content normal.     Assessment/Plan Peripheral neuropathy SCS lead failure  PLAN revision SCS lead  Bonna Gains 02/16/2014, 7:35 AM

## 2014-02-16 ENCOUNTER — Ambulatory Visit (HOSPITAL_COMMUNITY): Payer: Medicare Other

## 2014-02-16 ENCOUNTER — Encounter (HOSPITAL_COMMUNITY): Payer: Self-pay | Admitting: *Deleted

## 2014-02-16 ENCOUNTER — Encounter (HOSPITAL_COMMUNITY)
Admission: RE | Disposition: A | Discharge status: Home / Self Care | Payer: Self-pay | Source: Ambulatory Visit | Attending: Anesthesiology

## 2014-02-16 ENCOUNTER — Encounter (HOSPITAL_COMMUNITY): Payer: Medicare Other | Admitting: Anesthesiology

## 2014-02-16 ENCOUNTER — Ambulatory Visit (HOSPITAL_COMMUNITY)
Admission: RE | Admit: 2014-02-16 | Discharge: 2014-02-16 | Disposition: A | Discharge status: Home / Self Care | Payer: Medicare Other | Source: Ambulatory Visit | Attending: Anesthesiology | Admitting: Anesthesiology

## 2014-02-16 ENCOUNTER — Ambulatory Visit (HOSPITAL_COMMUNITY): Payer: Medicare Other | Admitting: Anesthesiology

## 2014-02-16 DIAGNOSIS — Y831 Surgical operation with implant of artificial internal device as the cause of abnormal reaction of the patient, or of later complication, without mention of misadventure at the time of the procedure: Secondary | ICD-10-CM | POA: Insufficient documentation

## 2014-02-16 DIAGNOSIS — Z888 Allergy status to other drugs, medicaments and biological substances status: Secondary | ICD-10-CM | POA: Insufficient documentation

## 2014-02-16 DIAGNOSIS — J4489 Other specified chronic obstructive pulmonary disease: Secondary | ICD-10-CM | POA: Insufficient documentation

## 2014-02-16 DIAGNOSIS — G2581 Restless legs syndrome: Secondary | ICD-10-CM | POA: Insufficient documentation

## 2014-02-16 DIAGNOSIS — F3289 Other specified depressive episodes: Secondary | ICD-10-CM | POA: Insufficient documentation

## 2014-02-16 DIAGNOSIS — H3530 Unspecified macular degeneration: Secondary | ICD-10-CM | POA: Insufficient documentation

## 2014-02-16 DIAGNOSIS — M961 Postlaminectomy syndrome, not elsewhere classified: Secondary | ICD-10-CM | POA: Insufficient documentation

## 2014-02-16 DIAGNOSIS — F411 Generalized anxiety disorder: Secondary | ICD-10-CM | POA: Insufficient documentation

## 2014-02-16 DIAGNOSIS — F329 Major depressive disorder, single episode, unspecified: Secondary | ICD-10-CM | POA: Insufficient documentation

## 2014-02-16 DIAGNOSIS — I10 Essential (primary) hypertension: Secondary | ICD-10-CM | POA: Insufficient documentation

## 2014-02-16 DIAGNOSIS — Z87891 Personal history of nicotine dependence: Secondary | ICD-10-CM | POA: Insufficient documentation

## 2014-02-16 DIAGNOSIS — H8110 Benign paroxysmal vertigo, unspecified ear: Secondary | ICD-10-CM | POA: Insufficient documentation

## 2014-02-16 DIAGNOSIS — E538 Deficiency of other specified B group vitamins: Secondary | ICD-10-CM | POA: Insufficient documentation

## 2014-02-16 DIAGNOSIS — G5790 Unspecified mononeuropathy of unspecified lower limb: Secondary | ICD-10-CM | POA: Insufficient documentation

## 2014-02-16 DIAGNOSIS — T85695A Other mechanical complication of other nervous system device, implant or graft, initial encounter: Secondary | ICD-10-CM | POA: Insufficient documentation

## 2014-02-16 DIAGNOSIS — E079 Disorder of thyroid, unspecified: Secondary | ICD-10-CM | POA: Insufficient documentation

## 2014-02-16 DIAGNOSIS — M069 Rheumatoid arthritis, unspecified: Secondary | ICD-10-CM | POA: Insufficient documentation

## 2014-02-16 DIAGNOSIS — Y9210 Unspecified residential institution as the place of occurrence of the external cause: Secondary | ICD-10-CM | POA: Insufficient documentation

## 2014-02-16 DIAGNOSIS — K219 Gastro-esophageal reflux disease without esophagitis: Secondary | ICD-10-CM | POA: Insufficient documentation

## 2014-02-16 DIAGNOSIS — Z880 Allergy status to penicillin: Secondary | ICD-10-CM | POA: Insufficient documentation

## 2014-02-16 DIAGNOSIS — D649 Anemia, unspecified: Secondary | ICD-10-CM | POA: Insufficient documentation

## 2014-02-16 DIAGNOSIS — J449 Chronic obstructive pulmonary disease, unspecified: Secondary | ICD-10-CM | POA: Insufficient documentation

## 2014-02-16 HISTORY — PX: SPINAL CORD STIMULATOR INSERTION: SHX5378

## 2014-02-16 SURGERY — INSERTION, SPINAL CORD STIMULATOR, LUMBAR
Anesthesia: Monitor Anesthesia Care | Site: Back

## 2014-02-16 MED ORDER — HYDROMORPHONE HCL PF 1 MG/ML IJ SOLN
INTRAMUSCULAR | Status: AC
  Filled 2014-02-16: qty 1

## 2014-02-16 MED ORDER — OXYCODONE HCL 5 MG/5ML PO SOLN: 5.0000 mg | Freq: Once | ORAL | Status: AC | PRN

## 2014-02-16 MED ORDER — OXYCODONE HCL 5 MG PO TABS
ORAL_TABLET | ORAL | Status: AC
  Filled 2014-02-16: qty 1

## 2014-02-16 MED ORDER — FENTANYL CITRATE 0.05 MG/ML IJ SOLN
INTRAMUSCULAR | Status: AC
  Filled 2014-02-16: qty 5

## 2014-02-16 MED ORDER — CLINDAMYCIN HCL 150 MG PO CAPS: 150.0000 mg | ORAL_CAPSULE | Freq: Three times a day (TID) | ORAL | Status: DC

## 2014-02-16 MED ORDER — SUCCINYLCHOLINE CHLORIDE 20 MG/ML IJ SOLN
INTRAMUSCULAR | Status: AC
  Filled 2014-02-16: qty 1

## 2014-02-16 MED ORDER — SODIUM CHLORIDE 0.9 % IR SOLN
Status: DC | PRN
  Administered 2014-02-16 (×2)

## 2014-02-16 MED ORDER — MIDAZOLAM HCL 2 MG/2ML IJ SOLN
INTRAMUSCULAR | Status: AC
  Filled 2014-02-16: qty 2

## 2014-02-16 MED ORDER — OXYCODONE HCL 5 MG PO TABS
5.0000 mg | ORAL_TABLET | Freq: Once | ORAL | Status: AC | PRN
Start: 2014-02-16 — End: 2014-02-16
  Administered 2014-02-16: 5 mg via ORAL

## 2014-02-16 MED ORDER — FENTANYL CITRATE 0.05 MG/ML IJ SOLN
INTRAMUSCULAR | Status: DC | PRN
  Administered 2014-02-16: 50 ug via INTRAVENOUS

## 2014-02-16 MED ORDER — PROPOFOL 10 MG/ML IV EMUL
INTRAVENOUS | Status: AC
  Filled 2014-02-16: qty 100

## 2014-02-16 MED ORDER — BACITRACIN-NEOMYCIN-POLYMYXIN OINTMENT TUBE
TOPICAL_OINTMENT | CUTANEOUS | Status: DC | PRN
  Administered 2014-02-16: 1 via TOPICAL

## 2014-02-16 MED ORDER — PROPOFOL INFUSION 10 MG/ML OPTIME
INTRAVENOUS | Status: DC | PRN
  Administered 2014-02-16: 50 ug/kg/min via INTRAVENOUS

## 2014-02-16 MED ORDER — MIDAZOLAM HCL 5 MG/5ML IJ SOLN
INTRAMUSCULAR | Status: DC | PRN
  Administered 2014-02-16: 2 mg via INTRAVENOUS

## 2014-02-16 MED ORDER — OXYCODONE-ACETAMINOPHEN 10-325 MG PO TABS: 1.0000 | ORAL_TABLET | Freq: Every day | ORAL | Status: DC

## 2014-02-16 MED ORDER — PROPOFOL 10 MG/ML IV BOLUS
INTRAVENOUS | Status: DC | PRN
  Administered 2014-02-16: 20 mg via INTRAVENOUS
  Administered 2014-02-16: 10 mg via INTRAVENOUS
  Administered 2014-02-16: 20 mg via INTRAVENOUS
  Administered 2014-02-16: 40 mg via INTRAVENOUS
  Administered 2014-02-16 (×4): 20 mg via INTRAVENOUS

## 2014-02-16 MED ORDER — PROPOFOL 10 MG/ML IV BOLUS
INTRAVENOUS | Status: AC
  Filled 2014-02-16: qty 20

## 2014-02-16 MED ORDER — LACTATED RINGERS IV SOLN
INTRAVENOUS | Status: DC | PRN
  Administered 2014-02-16: 08:00:00 via INTRAVENOUS

## 2014-02-16 MED ORDER — BUPIVACAINE-EPINEPHRINE (PF) 0.5% -1:200000 IJ SOLN
INTRAMUSCULAR | Status: DC | PRN
Start: 2014-02-16 — End: 2014-02-16
  Administered 2014-02-16: 27 mL

## 2014-02-16 MED ORDER — HYDROMORPHONE HCL PF 1 MG/ML IJ SOLN
0.2500 mg | INTRAMUSCULAR | Status: DC | PRN
  Administered 2014-02-16 (×3): 0.5 mg via INTRAVENOUS

## 2014-02-16 SURGICAL SUPPLY — 66 items
ADHESIVE MEDICAL (Stimulator) ×2 IMPLANT
ANCHOR CLICK (Anchor) ×2 IMPLANT
ANCHOR CLIK NEURO F/LEAD 2 (Anchor) ×2 IMPLANT
BAG DECANTER FOR FLEXI CONT (MISCELLANEOUS) ×2 IMPLANT
BENZOIN TINCTURE PRP APPL 2/3 (GAUZE/BANDAGES/DRESSINGS) IMPLANT
BINDER ABDOMINAL 12 ML 46-62 (SOFTGOODS) ×2 IMPLANT
BLADE 10 SAFETY STRL DISP (BLADE) ×2 IMPLANT
BLADE SURG ROTATE 9660 (MISCELLANEOUS) IMPLANT
CABLE/EXTENSION OR 1X16 61 (CABLE) ×4 IMPLANT
CHLORAPREP W/TINT 26ML (MISCELLANEOUS) ×2 IMPLANT
CONT SPEC 4OZ CLIKSEAL STRL BL (MISCELLANEOUS) ×2 IMPLANT
DERMABOND ADHESIVE PROPEN (GAUZE/BANDAGES/DRESSINGS) ×1
DERMABOND ADVANCED (GAUZE/BANDAGES/DRESSINGS) ×1
DERMABOND ADVANCED .7 DNX12 (GAUZE/BANDAGES/DRESSINGS) ×1 IMPLANT
DERMABOND ADVANCED .7 DNX6 (GAUZE/BANDAGES/DRESSINGS) ×1 IMPLANT
DRAPE C-ARM 42X72 X-RAY (DRAPES) ×2 IMPLANT
DRAPE C-ARMOR (DRAPES) ×2 IMPLANT
DRAPE LAPAROTOMY 100X72X124 (DRAPES) ×2 IMPLANT
DRAPE POUCH INSTRU U-SHP 10X18 (DRAPES) ×2 IMPLANT
DRAPE SURG 17X23 STRL (DRAPES) ×2 IMPLANT
DRESSING TELFA 8X3 (GAUZE/BANDAGES/DRESSINGS) IMPLANT
DRSG OPSITE POSTOP 3X4 (GAUZE/BANDAGES/DRESSINGS) IMPLANT
DRSG OPSITE POSTOP 4X6 (GAUZE/BANDAGES/DRESSINGS) IMPLANT
ELECT REM PT RETURN 9FT ADLT (ELECTROSURGICAL) ×2
ELECTRODE REM PT RTRN 9FT ADLT (ELECTROSURGICAL) ×1 IMPLANT
GAUZE SPONGE 4X4 16PLY XRAY LF (GAUZE/BANDAGES/DRESSINGS) ×2 IMPLANT
GLOVE BIOGEL PI IND STRL 8 (GLOVE) ×1 IMPLANT
GLOVE BIOGEL PI INDICATOR 8 (GLOVE) ×1
GLOVE ECLIPSE 7.5 STRL STRAW (GLOVE) ×4 IMPLANT
GLOVE EXAM NITRILE LRG STRL (GLOVE) IMPLANT
GLOVE EXAM NITRILE MD LF STRL (GLOVE) IMPLANT
GLOVE EXAM NITRILE XL STR (GLOVE) IMPLANT
GLOVE EXAM NITRILE XS STR PU (GLOVE) IMPLANT
GOWN BRE IMP SLV AUR LG STRL (GOWN DISPOSABLE) IMPLANT
GOWN BRE IMP SLV AUR XL STRL (GOWN DISPOSABLE) IMPLANT
GOWN STRL REIN 2XL LVL4 (GOWN DISPOSABLE) IMPLANT
GOWN STRL REUS W/ TWL XL LVL3 (GOWN DISPOSABLE) ×1 IMPLANT
GOWN STRL REUS W/TWL 2XL LVL3 (GOWN DISPOSABLE) ×4 IMPLANT
GOWN STRL REUS W/TWL XL LVL3 (GOWN DISPOSABLE) ×1
INTRODUCER 12.7CM 22.8CM (MISCELLANEOUS) ×2 IMPLANT
KIT BASIN OR (CUSTOM PROCEDURE TRAY) ×2 IMPLANT
KIT ROOM TURNOVER OR (KITS) ×2 IMPLANT
LEAD KIT CONTACT INFINION 16 (Stimulator) ×2 IMPLANT
NEEDLE 18GX1X1/2 (RX/OR ONLY) (NEEDLE) IMPLANT
NEEDLE HYPO 25X1 1.5 SAFETY (NEEDLE) ×2 IMPLANT
NS IRRIG 1000ML POUR BTL (IV SOLUTION) ×2 IMPLANT
PACK LAMINECTOMY NEURO (CUSTOM PROCEDURE TRAY) ×2 IMPLANT
PAD ARMBOARD 7.5X6 YLW CONV (MISCELLANEOUS) ×10 IMPLANT
PLUG PORT SPARE SPECTRA (MISCELLANEOUS) ×2 IMPLANT
SPONGE LAP 4X18 X RAY DECT (DISPOSABLE) ×2 IMPLANT
SPONGE SURGIFOAM ABS GEL SZ50 (HEMOSTASIS) IMPLANT
STAPLER SKIN PROX WIDE 3.9 (STAPLE) ×2 IMPLANT
STRIP CLOSURE SKIN 1/2X4 (GAUZE/BANDAGES/DRESSINGS) IMPLANT
SUT MNCRL AB 4-0 PS2 18 (SUTURE) IMPLANT
SUT SILK 0 (SUTURE) ×1
SUT SILK 0 MO-6 18XCR BRD 8 (SUTURE) ×1 IMPLANT
SUT SILK 0 TIES 10X30 (SUTURE) IMPLANT
SUT SILK 2 0 TIES 10X30 (SUTURE) IMPLANT
SUT VIC AB 2-0 CP2 18 (SUTURE) ×4 IMPLANT
SYRINGE 10CC LL (SYRINGE) IMPLANT
TOOL TUNNELING 35CM (MISCELLANEOUS) ×2 IMPLANT
TOWEL OR 17X24 6PK STRL BLUE (TOWEL DISPOSABLE) ×2 IMPLANT
TOWEL OR 17X26 10 PK STRL BLUE (TOWEL DISPOSABLE) ×2 IMPLANT
WATER STERILE IRR 1000ML POUR (IV SOLUTION) ×2 IMPLANT
WRENCH HEX 4.3 (SPINAL CORD STIMULATOR) ×2 IMPLANT
YANKAUER SUCT BULB TIP NO VENT (SUCTIONS) ×2 IMPLANT

## 2014-02-16 NOTE — Transfer of Care (Signed)
Immediate Anesthesia Transfer of Care Note  Patient: Katrina Lynn  Procedure(s) Performed: Procedure(s): SPINAL CORD STIMULATOR REVISION (N/A)  Patient Location: PACU  Anesthesia Type:MAC  Level of Consciousness: awake  Airway & Oxygen Therapy: Patient Spontanous Breathing and Patient connected to nasal cannula oxygen  Post-op Assessment: Report given to PACU RN, Post -op Vital signs reviewed and stable and Patient moving all extremities  Post vital signs: Reviewed and stable  Complications: No apparent anesthesia complications

## 2014-02-16 NOTE — Op Note (Signed)
PREOP DX: 1) lumbago  2) peripheral radiculopathy  3) lumbar post-laminectomy syndrome  4) chronic pain  POSTOP DX: 1) lumbago  2) peripheral radiculopathy  3) lumbar post-laminectomy syndrome  4) chronic pain  PROCEDURES PERFORMED:1) intraop fluoro 2) revision/replacement of  16 contact [place] leads 3) post-operative complex SCS programming. SURGEON:Toriann Spadoni  ASSISTANT: NONE  ANESTHESIA: MAC  EBL: <20cc  DESCRIPTION OF PROCEDURE: After a discussion of risks, benefits and alternatives, informed consent was obtained. The patient was taken to the OR, turned prone onto a Jackson table, all pressure points padded, SCD's placed, and an adequate plane of anesthesia induced. A timeout was taken to verify the correct patient, position, personnel, availability of appropriate equipment, and administration of perioperative antibiotics.  The thoracic and lumbar areas were widely prepped with chloraprep and draped into a sterile field. After infiltrating the previous IPG incision with 1% lidocaine,  an incision made with a 10 blade and carried down to the generator with the careful sharp and blunt dissection. Once the generator and coiled leads were freed from scar tissue, the left splitter was removed, and the lead itself checked. All impedances were >16K, consistent with the lead itself being damaged. The other previous incision was infiltrated with 1% lidocaine and incised, careful dissection to the lead anchor achieved and the anchor removed. Attempts to then access the epidural space using an angiocath over the lead; the lead was thus withdrawn, and  a 14g [employer] tuohy needle placed into the epidural space at the L1-2 interspace using biplanar fluoro and loss-of-resistance technique. The needle was aspirated without any return of fluid. A [employer] INFINION lead was introduced and under live AP fluoro advanced until the distal-most contact overlay the midportion of the  T8  vertebral body shadow with the rest of the contacts distributed over the T8and T9 vertebral bodies in a position just left of anatomic midline, and the other previously placed lead. Scar tissue or other obstruction prohibited further advancement.   The patient was awakened and the leads tested; impedances were good, and the patient reported good coverage with amplitudes in the 8-15 mA range. 0 silk sutures were placed in the fascia adjacent to the needle. The needle and stylet were removed under fluoroscopy with no lead migration noted. The new lead was then fixed to the fascia by a click anchor fixation the sutures; repeat images were obtained to verify that there had been no lead migration.    Using reverse seldinger technique, the leads were tunneled back  to the pocket site, and the leads inserted into the SCS generator. Impedances were checked, and all found to be excellent. The leads were then all fixed into position with a self-torquing wrench. The wiring was all carefully coiled, placed behind the generator and placed in the pocket.  Both incisions were copiously irrigated with bacitracin-containing irrigation. The lumbar incision was closed in 2 deep layers of interrupted 2-0 vicryl and the skin closed with staples. The pocket incision was closed with a deeper layer of 2-0 vicryl interrupted sutures, and the skin closed with staples. Sterile dressings were applied. Needle, sponge, and instrument counts were correct x2 at the end of the case.  The patient was then carefully awakened from anesthesia, turned supine, an abdominal binder placed, and the patient taken to the recovery room where he underwent complex spinal cord stimulator programming.  COMPLICATIONS: NONE  CONDITION: Stable throughout the course of the procedure and immediately afterward  DISPOSITION: discharge to home, with  antibiotics and pain medicine. Discussed care with the patient and spouse. Followup in clinic will be scheduled in  10-14 days.

## 2014-02-16 NOTE — Anesthesia Preprocedure Evaluation (Addendum)
Anesthesia Evaluation  Patient identified by MRN, date of birth, ID band Patient awake    Reviewed: Allergy & Precautions, H&P , NPO status , Patient's Chart, lab work & pertinent test results, reviewed documented beta blocker date and time   Airway Mallampati: III TM Distance: >3 FB Neck ROM: Full    Dental no notable dental hx. (+) Upper Dentures, Lower Dentures, Dental Advisory Given   Pulmonary asthma , COPDformer smoker,  breath sounds clear to auscultation  Pulmonary exam normal       Cardiovascular hypertension, On Medications and On Home Beta Blockers Rhythm:Regular Rate:Normal     Neuro/Psych Anxiety Depression negative neurological ROS     GI/Hepatic Neg liver ROS, GERD-  Medicated and Controlled,  Endo/Other  negative endocrine ROS  Renal/GU negative Renal ROS  negative genitourinary   Musculoskeletal   Abdominal   Peds  Hematology negative hematology ROS (+) anemia ,   Anesthesia Other Findings   Reproductive/Obstetrics negative OB ROS                          Anesthesia Physical Anesthesia Plan  ASA: III  Anesthesia Plan: MAC   Post-op Pain Management:    Induction: Intravenous  Airway Management Planned: Simple Face Mask and Nasal Cannula  Additional Equipment:   Intra-op Plan:   Post-operative Plan:   Informed Consent: I have reviewed the patients History and Physical, chart, labs and discussed the procedure including the risks, benefits and alternatives for the proposed anesthesia with the patient or authorized representative who has indicated his/her understanding and acceptance.   Dental advisory given  Plan Discussed with: CRNA, Surgeon and Anesthesiologist  Anesthesia Plan Comments:        Anesthesia Quick Evaluation

## 2014-02-16 NOTE — Discharge Instructions (Signed)
1) advance diet and activity as tolerated 2) may remove dressings and shower 5/11 3) abdominal binder as much as possible for 1st seven days 4) ice packs to affected areas 20 minutes at a time PRN 5) no submerging in standing water, bath, etc. Until cleared by MD 6) office will call early next week to schedule follow up appointment

## 2014-02-16 NOTE — Anesthesia Postprocedure Evaluation (Signed)
  Anesthesia Post-op Note  Patient: Katrina Lynn  Procedure(s) Performed: Procedure(s): SPINAL CORD STIMULATOR REVISION (N/A)  Patient Location: PACU  Anesthesia Type: MAC  Level of Consciousness: awake and alert   Airway and Oxygen Therapy: Patient Spontanous Breathing  Post-op Pain: moderate  Post-op Assessment: Post-op Vital signs reviewed, Patient's Cardiovascular Status Stable and Respiratory Function Stable  Post-op Vital Signs: Reviewed  Filed Vitals:   02/16/14 1043  BP:   Pulse: 71  Temp:   Resp: 20    Complications: No apparent anesthesia complications

## 2014-02-22 ENCOUNTER — Encounter (HOSPITAL_COMMUNITY): Payer: Self-pay | Admitting: Anesthesiology

## 2014-05-28 ENCOUNTER — Ambulatory Visit (INDEPENDENT_AMBULATORY_CARE_PROVIDER_SITE_OTHER): Payer: Medicare Other | Admitting: Adult Health

## 2014-05-28 ENCOUNTER — Encounter: Payer: Self-pay | Admitting: Adult Health

## 2014-05-28 VITALS — BP 129/83 | HR 106 | Wt 157.0 lb

## 2014-05-28 DIAGNOSIS — R269 Unspecified abnormalities of gait and mobility: Secondary | ICD-10-CM

## 2014-05-28 DIAGNOSIS — G63 Polyneuropathy in diseases classified elsewhere: Secondary | ICD-10-CM

## 2014-05-28 DIAGNOSIS — G2581 Restless legs syndrome: Secondary | ICD-10-CM

## 2014-05-28 DIAGNOSIS — H8113 Benign paroxysmal vertigo, bilateral: Secondary | ICD-10-CM

## 2014-05-28 DIAGNOSIS — H8110 Benign paroxysmal vertigo, unspecified ear: Secondary | ICD-10-CM
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
## 2014-05-28 NOTE — Patient Instructions (Signed)

## 2014-05-29 ENCOUNTER — Telehealth: Payer: Self-pay | Admitting: *Deleted

## 2014-05-29 NOTE — Telephone Encounter (Signed)
Called patient to see if she lives closer to [place] or [place] in order to submit her therapy referral.

## 2014-06-26 ENCOUNTER — Ambulatory Visit: Payer: Medicare Other | Attending: Neurology | Admitting: Physical Therapy

## 2014-06-26 DIAGNOSIS — R42 Dizziness and giddiness: Secondary | ICD-10-CM | POA: Diagnosis not present

## 2014-06-26 DIAGNOSIS — IMO0001 Reserved for inherently not codable concepts without codable children: Secondary | ICD-10-CM | POA: Diagnosis present

## 2014-06-26 DIAGNOSIS — R269 Unspecified abnormalities of gait and mobility: Secondary | ICD-10-CM | POA: Insufficient documentation

## 2014-07-03 ENCOUNTER — Ambulatory Visit: Payer: Medicare Other | Admitting: Physical Therapy

## 2014-07-27 ENCOUNTER — Ambulatory Visit: Payer: Medicare Other | Admitting: Nurse Practitioner

## 2014-08-29 ENCOUNTER — Encounter: Payer: Self-pay | Admitting: Neurology

## 2014-09-04 ENCOUNTER — Other Ambulatory Visit: Payer: Self-pay

## 2014-09-04 ENCOUNTER — Encounter: Payer: Self-pay | Admitting: Neurology

## 2014-09-04 MED ORDER — DULOXETINE HCL 60 MG PO CPEP: 60.0000 mg | ORAL_CAPSULE | Freq: Two times a day (BID) | ORAL | Status: AC

## 2014-11-28 ENCOUNTER — Telehealth: Payer: Self-pay | Admitting: *Deleted

## 2014-11-28 ENCOUNTER — Ambulatory Visit: Payer: Medicare Other | Admitting: Adult Health

## 2014-11-28 NOTE — Telephone Encounter (Signed)
Patient did not show up for appointment must reschedule

## 2014-11-29 ENCOUNTER — Encounter: Payer: Self-pay | Admitting: Adult Health

## 2016-05-24 ENCOUNTER — Emergency Department (HOSPITAL_COMMUNITY): Payer: Medicare Other

## 2016-05-24 ENCOUNTER — Encounter (HOSPITAL_COMMUNITY): Payer: Self-pay

## 2016-05-24 ENCOUNTER — Emergency Department (HOSPITAL_COMMUNITY)
Admission: EM | Admit: 2016-05-24 | Discharge: 2016-05-24 | Disposition: A | Discharge status: Home / Self Care | Payer: Medicare Other | Attending: Emergency Medicine | Admitting: Emergency Medicine

## 2016-05-24 DIAGNOSIS — Y9301 Activity, walking, marching and hiking: Secondary | ICD-10-CM | POA: Diagnosis not present

## 2016-05-24 DIAGNOSIS — W0110XA Fall on same level from slipping, tripping and stumbling with subsequent striking against unspecified object, initial encounter: Secondary | ICD-10-CM | POA: Insufficient documentation

## 2016-05-24 DIAGNOSIS — Y92002 Bathroom of unspecified non-institutional (private) residence single-family (private) house as the place of occurrence of the external cause: Secondary | ICD-10-CM | POA: Insufficient documentation

## 2016-05-24 DIAGNOSIS — Z79899 Other long term (current) drug therapy: Secondary | ICD-10-CM | POA: Insufficient documentation

## 2016-05-24 DIAGNOSIS — Z87891 Personal history of nicotine dependence: Secondary | ICD-10-CM | POA: Diagnosis not present

## 2016-05-24 DIAGNOSIS — I10 Essential (primary) hypertension: Secondary | ICD-10-CM | POA: Diagnosis not present

## 2016-05-24 DIAGNOSIS — J449 Chronic obstructive pulmonary disease, unspecified: Secondary | ICD-10-CM | POA: Insufficient documentation

## 2016-05-24 DIAGNOSIS — Z96651 Presence of right artificial knee joint: Secondary | ICD-10-CM | POA: Insufficient documentation

## 2016-05-24 DIAGNOSIS — F0781 Postconcussional syndrome: Secondary | ICD-10-CM

## 2016-05-24 DIAGNOSIS — Y999 Unspecified external cause status: Secondary | ICD-10-CM | POA: Diagnosis not present

## 2016-05-24 DIAGNOSIS — R5383 Other fatigue: Secondary | ICD-10-CM | POA: Insufficient documentation

## 2016-05-24 HISTORY — DX: Diaphragmatic hernia without obstruction or gangrene: K44.9

## 2016-05-24 LAB — CBC WITH DIFFERENTIAL/PLATELET
Basophils Absolute: 0.1 10*3/uL (ref 0.0–0.1)
Basophils Relative: 1 %
EOS PCT: 5 %
Eosinophils Absolute: 0.4 10*3/uL (ref 0.0–0.7)
HEMATOCRIT: 36.3 % (ref 36.0–46.0)
Hemoglobin: 11.9 g/dL — ABNORMAL LOW (ref 12.0–15.0)
LYMPHS ABS: 3.1 10*3/uL (ref 0.7–4.0)
LYMPHS PCT: 34 %
MCH: 32 pg (ref 26.0–34.0)
MCHC: 32.8 g/dL (ref 30.0–36.0)
MCV: 97.6 fL (ref 78.0–100.0)
MONO ABS: 1.2 10*3/uL — AB (ref 0.1–1.0)
MONOS PCT: 13 %
NEUTROS ABS: 4.4 10*3/uL (ref 1.7–7.7)
Neutrophils Relative %: 47 %
PLATELETS: 350 10*3/uL (ref 150–400)
RBC: 3.72 MIL/uL — ABNORMAL LOW (ref 3.87–5.11)
RDW: 13.7 % (ref 11.5–15.5)
WBC: 9.1 10*3/uL (ref 4.0–10.5)

## 2016-05-24 LAB — COMPREHENSIVE METABOLIC PANEL
ALBUMIN: 3 g/dL — AB (ref 3.5–5.0)
ALT: 9 U/L — ABNORMAL LOW (ref 14–54)
ANION GAP: 7 (ref 5–15)
AST: 13 U/L — AB (ref 15–41)
Alkaline Phosphatase: 28 U/L — ABNORMAL LOW (ref 38–126)
BILIRUBIN TOTAL: 0.6 mg/dL (ref 0.3–1.2)
BUN: 12 mg/dL (ref 6–20)
CO2: 25 mmol/L (ref 22–32)
Calcium: 8.4 mg/dL — ABNORMAL LOW (ref 8.9–10.3)
Chloride: 101 mmol/L (ref 101–111)
Creatinine, Ser: 0.83 mg/dL (ref 0.44–1.00)
GFR calc Af Amer: >60 mL/min (ref >60)
GFR calc non Af Amer: >60 mL/min (ref >60)
GLUCOSE: 86 mg/dL (ref 65–99)
POTASSIUM: 4.2 mmol/L (ref 3.5–5.1)
SODIUM: 133 mmol/L — AB (ref 135–145)
Total Protein: 5.5 g/dL — ABNORMAL LOW (ref 6.5–8.1)

## 2016-05-24 LAB — URINALYSIS, ROUTINE W REFLEX MICROSCOPIC
Bilirubin Urine: NEGATIVE
Glucose, UA: NEGATIVE mg/dL
HGB URINE DIPSTICK: NEGATIVE
Ketones, ur: NEGATIVE mg/dL
Leukocytes, UA: NEGATIVE
Nitrite: NEGATIVE
PH: 6.5 (ref 5.0–8.0)
Protein, ur: NEGATIVE mg/dL
SPECIFIC GRAVITY, URINE: 1.014 (ref 1.005–1.030)

## 2016-05-24 LAB — I-STAT TROPONIN, ED: Troponin i, poc: 0.01 ng/mL (ref 0.00–0.08)

## 2016-05-24 MED ORDER — SODIUM CHLORIDE 0.9 % IV BOLUS (SEPSIS)
1000.0000 mL | Freq: Once | INTRAVENOUS | Status: AC
  Administered 2016-05-24: 1000 mL via INTRAVENOUS

## 2016-05-24 NOTE — ED Notes (Signed)
Pt taken to restroom via wheelchair with this RN.   

## 2016-05-24 NOTE — ED Triage Notes (Signed)
Per [facility]: Pt is coming from home. Pt woke up feeling fatigued. Home health found low blood pressure, systolic of 80. Home health was concerned that she may have taken too much medication, but the pill box was counted and everything was adding up appropriately. Pt has been able to answer all questions appropriately. Stroke screen was negative. Pt always wears 2 L [place] at home for her COPD. Lung sounds were clear    Per home health the fentanyl patch was applied to the left lower quadrant. Fentanyl patch is under a band aid, the fentanyl patch reads "12 mcg/hr" and then written in black sharpie "8/11"   Pt states that "I just feel really tired and yesterday I slept all day" Pt states that she fell yesterday onto her right hip. Pt states that she hit her head, she is not on blood thinners, and that she did not lose consciousness. Pt states that she went to [facility] and they did a CT scan on her head and that it came back ok.

## 2016-05-24 NOTE — ED Notes (Signed)
Patient transported to CT 

## 2016-05-24 NOTE — ED Provider Notes (Signed)
Medical screening examination/treatment/procedure(s) were conducted as a shared visit with non-physician practitioner(s) and myself.  I personally evaluated the patient during the encounter.   EKG Interpretation  Date/Time:  Sunday May 24 2016 12:38:32 EDT Ventricular Rate:  69 PR Interval:    QRS Duration: 80 QT Interval:  389 QTC Calculation: 417 R Axis:   5 Text Interpretation:  Sinus rhythm No significant change since last tracing Confirmed by Nyeema Want  MD, Anuja Manka ([id number]) on 05/24/2016 1:48:04 PM     Patient here with increased fatigue since a fall several days ago. Seen at outside facility had a head CT which is negative. Continues to complain of mild diffuse headache without confusion. She's also had some foul-smelling urine. Head CT is negative. Urinalysis is pending. Suspect that she has postconcussive syndrome and/or possible UTI   Lacretia Leigh, MD 05/24/16 [phone]

## 2016-05-24 NOTE — ED Provider Notes (Signed)
[facility] DEPT Provider Note   CSN: [id number] Arrival date & time: 05/24/16  1217  First Provider Contact:  First MD Initiated Contact with Patient 05/24/16 1253        History   Chief Complaint Chief Complaint  Patient presents with  . Fatigue    HPI Lamaiya Bevins is a 82 y.o. female.  Hina Grissom is a 82 y.o. Female who presents to the emergency department via EMS with her friends complaining of feeling fatigued over the past 3 days. Patient reports she tripped and fell three days ago and hit the front of her head. She was seen at [facility] and had a normal CT of her head as well as normal x-rays. The patient was discharged home. She reports since then she is just not felt herself. She reports she's been very fatigued and slept all day today. She also reports she has a frontal headache. She's had a headache since she fall. There is some suspicion she may have taken more of her medicines than needed, but home health aide reports all of her medication is accounted for. The patient did not lose consciousness when she fell. She is not on any anticoagulants. Patient also complains of strong odor of her urine. She reports she's had no appetite and has not been eating or drinking much today. She reports having some chest pain earlier today that she attributes to her hiatal hernia. No current chest pain or shortness of breath. Friend at bedside reports that she's had some trouble with her memory today. This is unusual for her. Patient denies fevers, abdominal pain, nausea, vomiting, rashes, double vision, neck pain, back pain, numbness, tingling, focal weakness, dysuria, hematuria, coughing, chest pain or shortness of breath.  The history is provided by the patient and a friend. No language interpreter was used.    Past Medical History:  Diagnosis Date  . Abnormality of gait 01/25/2014  . Anxiety   . Arthritis   . Asthma   . Atrial fibrillation ([place])   . Benign  paroxysmal positional vertigo 07/24/2013  . Blood transfusion   . COPD (chronic obstructive pulmonary disease) ([place])   . Depression   . GERD (gastroesophageal reflux disease)   . Hernia, hiatal   . Hypertension   . Macular degeneration   . Other vitamin B12 deficiency anemia 01/25/2014  . Polyneuropathy in other diseases classified elsewhere ([place]) 07/24/2013  . Restless legs syndrome (RLS) 07/24/2013  . Rheumatoid arthritis ([place])   . Thyroid disease     Patient Active Problem List   Diagnosis Date Noted  . Abnormality of gait 01/25/2014  . Other vitamin B12 deficiency anemia 01/25/2014  . Polyneuropathy in other diseases classified elsewhere ([place]) 07/24/2013  . Benign paroxysmal positional vertigo 07/24/2013  . Restless legs syndrome (RLS) 07/24/2013    Past Surgical History:  Procedure Laterality Date  . ABDOMINAL HYSTERECTOMY    . ANKLE FRACTURE SURGERY     Left  . APPENDECTOMY    . BACK SURGERY     x3 lumbar and thorasic  kyphoplasty  . CATARACT EXTRACTION     bilateral 1990  . CHOLECYSTECTOMY    . KYPHOPLASTY      x  3  . REPLACEMENT TOTAL KNEE     Right  . SPINAL CORD STIMULATOR IMPLANT    . SPINAL CORD STIMULATOR INSERTION N/A 02/16/2014   Procedure: SPINAL CORD STIMULATOR REVISION;  Surgeon: Bonna Gains, MD;  Location: [facility];  Service: Neurosurgery;  Laterality:  N/A;  . TONSILLECTOMY      OB History    No data available       Home Medications    Prior to Admission medications   Medication Sig Start Date End Date Taking? Authorizing Provider  albuterol (PROAIR HFA) 108 (90 BASE) MCG/ACT inhaler Inhale 2 puffs into the lungs. 05/02/14 05/02/15  Historical Provider, MD  albuterol (PROVENTIL) (2.5 MG/3ML) 0.083% nebulizer solution 2.5 mg. 05/02/14 05/02/15  Historical Provider, MD  ALPRAZolam Duanne Moron) 0.5 MG tablet Take 0.25 mg by mouth at bedtime as needed for sleep.     Historical Provider, MD  cetirizine (ZYRTEC) 10 MG tablet Take 10 mg by mouth  daily.    Historical Provider, MD  Cholecalciferol (VITAMIN D) 400 UNITS capsule Take by mouth.    Historical Provider, MD  DULoxetine (CYMBALTA) 60 MG capsule Take 1 capsule (60 mg total) by mouth 2 (two) times daily. 09/04/14   Kathrynn Ducking, MD  folic acid (FOLVITE) 1 MG tablet Take 1 mg by mouth 2 (two) times daily.      Historical Provider, MD  gabapentin (NEURONTIN) 600 MG tablet Take 600 mg by mouth 3 (three) times daily.     Historical Provider, MD  HYDROcodone-acetaminophen (NORCO/VICODIN) 5-325 MG per tablet Take 5-325 tablets by mouth every 6 (six) hours as needed.  06/26/13   Historical Provider, MD  levothyroxine (SYNTHROID, LEVOTHROID) 200 MCG tablet Take 200 mcg by mouth daily before breakfast.    Historical Provider, MD  losartan (COZAAR) 100 MG tablet Take 100 mg by mouth every evening.     Historical Provider, MD  meclizine (ANTIVERT) 12.5 MG tablet Take 12.5 mg by mouth 3 (three) times daily as needed for dizziness or nausea.  07/19/13   Historical Provider, MD  metoprolol (TOPROL-XL) 50 MG 24 hr tablet Take 50 mg by mouth every morning.     Historical Provider, MD  mometasone-formoterol (DULERA) 100-5 MCG/ACT AERO Inhale 2 puffs into the lungs. 05/02/14   Historical Provider, MD  multivitamin-lutein (OCUVITE-LUTEIN) CAPS Take 1 capsule by mouth 2 (two) times daily. Called brite eyes    Historical Provider, MD  mupirocin ointment (BACTROBAN) 2 % Apply 1 application topically 3 (three) times daily.  05/25/13   Historical Provider, MD  oxyCODONE-acetaminophen (PERCOCET) 10-325 MG per tablet Take 1 tablet by mouth every 8 (eight) hours as needed. 02/16/14   Clydell Hakim, MD  pantoprazole (PROTONIX) 20 MG tablet Take 1 tablet by mouth daily. 01/01/14   Historical Provider, MD  polyethylene glycol powder (GLYCOLAX/MIRALAX) powder  01/29/14   Historical Provider, MD  predniSONE (DELTASONE) 5 MG tablet Take 5 mg by mouth daily. 06/19/13   Historical Provider, MD  Probiotic Product (VSL#3) CAPS  Take 1 capsule by mouth 2 (two) times daily.    Historical Provider, MD  venlafaxine XR (EFFEXOR-XR) 75 MG 24 hr capsule Take 1 capsule by mouth daily. 12/19/13   Historical Provider, MD  vitamin B-12 (CYANOCOBALAMIN) 1000 MCG tablet Take 1,000 mcg by mouth daily.    Historical Provider, MD    Family History Family History  Problem Relation Age of Onset  . Stroke Mother   . Emphysema Sister   . Heart disease Sister     Social History Social History  Substance Use Topics  . Smoking status: Former Smoker    Packs/day: 0.25    Years: 3.00    Types: Cigarettes    Quit date: 09/22/1971  . Smokeless tobacco: Never Used  . Alcohol use 0.6 oz/week  1 Cans of beer per week     Comment: 1 blood mary a day      Allergies   Penicillins and Iohexol   Review of Systems Review of Systems  Constitutional: Positive for appetite change and fatigue. Negative for chills and fever.  HENT: Negative for congestion, sore throat and trouble swallowing.   Eyes: Negative for pain and visual disturbance.  Respiratory: Negative for cough, shortness of breath and wheezing.   Cardiovascular: Positive for chest pain (Resolved.). Negative for palpitations.  Gastrointestinal: Negative for abdominal pain, diarrhea, nausea and vomiting.  Genitourinary: Positive for decreased urine volume. Negative for difficulty urinating, dysuria, urgency, vaginal bleeding and vaginal discharge.       Urine odor.  Musculoskeletal: Negative for back pain and neck pain.  Skin: Negative for rash.  Neurological: Positive for headaches. Negative for dizziness, seizures, syncope, speech difficulty, weakness and light-headedness.     Physical Exam Updated Vital Signs BP 124/75   Pulse 70   Temp 98.1 F (36.7 C)   Resp 17   Ht 5\' 6"  (1.676 m)   Wt 56.7 kg   SpO2 100%   BMI 20.18 kg/m   Physical Exam  Constitutional: She is oriented to person, place, and time. She appears well-developed and well-nourished. No  distress.  Nontoxic appearing.  HENT:  Head: Normocephalic and atraumatic.  Right Ear: External ear normal.  Left Ear: External ear normal.  Nose: Nose normal.  Mouth/Throat: Oropharynx is clear and moist. No oropharyngeal exudate.  No visible signs of head trauma. Mucous membranes are dry.  Eyes: Conjunctivae and EOM are normal. Pupils are equal, round, and reactive to light. Right eye exhibits no discharge. Left eye exhibits no discharge.  Neck: Normal range of motion. Neck supple. No JVD present. No tracheal deviation present.  Cardiovascular: Normal rate, regular rhythm, normal heart sounds and intact distal pulses.  Exam reveals no gallop and no friction rub.   No murmur heard. Pulmonary/Chest: Effort normal and breath sounds normal. No stridor. No respiratory distress. She has no wheezes. She has no rales. She exhibits no tenderness.  Lungs clear to auscultation bilaterally. Symmetric chest expansion bilaterally.  Abdominal: Soft. She exhibits no distension. There is no tenderness. There is no guarding.  Abdomen is soft and nontender to palpation.  Musculoskeletal: She exhibits tenderness. She exhibits no edema or deformity.  No midline neck or back tenderness. Patient has mild tenderness to her right wrist area this was x-rayed after her fall. No deformity.  Lymphadenopathy:    She has no cervical adenopathy.  Neurological: She is alert and oriented to person, place, and time. No cranial nerve deficit. Coordination normal.  Patient is alert and oriented 3. She is briefly confused about what day she had her fall. Her speech is clear and coherent. She does appear slightly sleepy. No pronator drift. Finger-to-nose intact bilaterally. Heel-to-shin intact. Good strength to her bilateral upper and lower extremities. Sensation is intact to her bilateral upper and lower extremities.  Skin: Skin is warm and dry. No rash noted. She is not diaphoretic. No erythema. No pallor.  Psychiatric: She  has a normal mood and affect. Her behavior is normal.  Nursing note and vitals reviewed.    ED Treatments / Results  Labs (all labs ordered are listed, but only abnormal results are displayed) Labs Reviewed  COMPREHENSIVE METABOLIC PANEL - Abnormal; Notable for the following:       Result Value   Sodium 133 (*)    Calcium 8.4 (*)  Total Protein 5.5 (*)    Albumin 3.0 (*)    AST 13 (*)    ALT 9 (*)    Alkaline Phosphatase 28 (*)    All other components within normal limits  CBC WITH DIFFERENTIAL/PLATELET - Abnormal; Notable for the following:    RBC 3.72 (*)    Hemoglobin 11.9 (*)    Monocytes Absolute 1.2 (*)    All other components within normal limits  URINALYSIS, ROUTINE W REFLEX MICROSCOPIC (NOT AT [facility])  I-STAT TROPOININ, ED    EKG  EKG Interpretation  Date/Time:  Sunday May 24 2016 12:38:32 EDT Ventricular Rate:  69 PR Interval:    QRS Duration: 80 QT Interval:  389 QTC Calculation: 417 R Axis:   5 Text Interpretation:  Sinus rhythm No significant change since last tracing Confirmed by Zenia Resides  MD, ANTHONY ([id number]) on 05/24/2016 1:48:04 PM       Radiology Dg Chest 2 View  Result Date: 05/24/2016 CLINICAL DATA:  Hypotension.  COPD.  Atrial fibrillation. EXAM: CHEST  2 VIEW COMPARISON:  02/08/2014 FINDINGS: Moderate-sized hiatal hernia in the retrocardiac position. Old healed left rib fractures. Dorsal column stimulators project over the posterior spinal canal, with a more right eccentric stimulator spanning from the T7 through T9 levels, and the more left eccentric stimulator spanning from the T8 through T10 levels. This corresponds to the revised appearance shown on the fluoroscopic exam of 02/16/2014. Atherosclerotic aortic arch. Barium contrast medium in the transverse colon. Lower thoracic compression fracture with vertebral augmentation, similar to prior. IMPRESSION: 1. No acute thoracic findings. 2. Moderate-sized hiatal hernia. 3. Dorsal column stimulator  positioning as noted above. 4. Atherosclerotic aortic arch. 5. Barium is present in the transverse colon. 6. Stable appearance of lower thoracic compression fracture with vertebral augmentation. Electronically Signed   By: Van Clines M.D.   On: 05/24/2016 13:48   Ct Head Wo Contrast  Result Date: 05/24/2016 CLINICAL DATA:  82 year old female tripped and fell while walking in the bathroom 3 days ago. Right side head pain. Altered mental status. Initial encounter. EXAM: CT HEAD WITHOUT CONTRAST TECHNIQUE: Contiguous axial images were obtained from the base of the skull through the vertex without intravenous contrast. COMPARISON:  Noncontrast head CT 08/01/2013. FINDINGS: Visualized paranasal sinuses and mastoids are stable and well pneumatized. No acute osseous abnormality identified. No scalp hematoma. Stable orbits soft tissues. Calcified atherosclerosis at the skull base. Mild additional generalized cerebral volume loss since 2014. No midline shift, ventriculomegaly, mass effect, evidence of mass lesion, intracranial hemorrhage or evidence of cortically based acute infarction. Gray-white matter differentiation is within normal limits throughout the brain. No suspicious intracranial vascular hyperdensity. IMPRESSION: No acute intracranial abnormality. No acute traumatic injury identified. Negative for age noncontrast CT appearance of the brain Electronically Signed   By: Genevie Ann M.D.   On: 05/24/2016 14:08    Procedures Procedures (including critical care time)  Medications Ordered in ED Medications  sodium chloride 0.9 % bolus 1,000 mL (0 mLs Intravenous Stopped 05/24/16 1535)     Initial Impression / Assessment and Plan / ED Course  I have reviewed the triage vital signs and the nursing notes.  Pertinent labs & imaging results that were available during my care of the patient were reviewed by me and considered in my medical decision making (see chart for details).  Clinical Course    This is a 82 y.o. Female who presents to the emergency department via EMS with her friends complaining of feeling fatigued over  the past 3 days. Patient reports she tripped and fell three days ago and hit the front of her head. She was seen at [facility] and had a normal CT of her head as well as normal x-rays. The patient was discharged home. She reports since then she is just not felt herself. She reports she's been very fatigued and slept all day today. She also reports she has a frontal headache. She's had a headache since she fall. There is some suspicion she may have taken more of her medicines than needed, but home health aide reports all of her medication is accounted for. The patient did not lose consciousness when she fell. She is not on any anticoagulants. Patient also complains of strong odor of her urine. She reports she's had no appetite and has not been eating or drinking much today. On exam patient is afebrile nontoxic appearing. No visible signs of head trauma. She has no focal neurological deficits. She does appear to be sleepy.  EKG shows no acute changes from her last tracing. No acute findings on head CT. Chest x-ray shows no acute findings. Urinalysis shows no signs of infection. Troponin is not elevated. CMP is unremarkable. CBC is unremarkable. Overall, unremarkable workup. Patient is tolerating graham crackers and water at bedside at recheck. Suspect post concussive syndrome after her fall. She is no focal nodule deficits and normal CT head. I did discussed the expected course and treatment of concussive syndrome. I encouraged mental rest and Tylenol as needed for headache. I encouraged her to follow-up this week with her primary care doctor for recheck. I advised the patient to follow-up with their primary care provider this week. I advised the patient to return to the emergency department with new or worsening symptoms or new concerns. The patient verbalized  understanding and agreement with plan.    This patient was discussed with and evaluated by Dr. Zenia Resides who agrees with assessment and plan.   Final Clinical Impressions(s) / ED Diagnoses   Final diagnoses:  Postconcussive syndrome    New Prescriptions New Prescriptions   No medications on file     Waynetta Pean, PA-C 05/24/16 1626
